# Patient Record
Sex: Female | Born: 1975 | Race: Black or African American | Hispanic: No | Marital: Married | Smoking: Never smoker
Health system: Southern US, Community
[De-identification: ages and names within clinical notes are randomized; demographics above are authoritative.]

## PROBLEM LIST (undated history)

## (undated) DIAGNOSIS — E669 Obesity, unspecified: Secondary | ICD-10-CM

## (undated) DIAGNOSIS — I10 Essential (primary) hypertension: Secondary | ICD-10-CM

## (undated) HISTORY — PX: TONSILLECTOMY: SUR1361

## (undated) HISTORY — PX: TYMPANOSTOMY TUBE PLACEMENT: SHX32

## (undated) HISTORY — DX: Obesity, unspecified: E66.9

---

## 2000-12-26 ENCOUNTER — Other Ambulatory Visit: Admission: RE | Admit: 2000-12-26 | Discharge: 2000-12-26 | Payer: Self-pay | Admitting: Family Medicine

## 2004-10-17 ENCOUNTER — Ambulatory Visit: Payer: Self-pay | Admitting: Family Medicine

## 2005-03-01 ENCOUNTER — Ambulatory Visit: Payer: Self-pay | Admitting: Family Medicine

## 2005-03-28 ENCOUNTER — Ambulatory Visit: Payer: Self-pay | Admitting: Family Medicine

## 2005-06-21 ENCOUNTER — Ambulatory Visit: Payer: Self-pay | Admitting: Family Medicine

## 2005-10-02 ENCOUNTER — Ambulatory Visit: Payer: Self-pay | Admitting: Family Medicine

## 2006-05-27 ENCOUNTER — Ambulatory Visit: Payer: Self-pay | Admitting: Family Medicine

## 2006-06-03 ENCOUNTER — Encounter: Payer: Self-pay | Admitting: Family Medicine

## 2006-06-03 LAB — CONVERTED CEMR LAB
Basophils Absolute: 0.1 10*3/uL (ref 0.0–0.1)
CO2: 26 meq/L (ref 19–32)
Calcium: 9.4 mg/dL (ref 8.4–10.5)
Chloride: 103 meq/L (ref 96–112)
Eosinophils Relative: 2 % (ref 0–5)
Glucose, Bld: 91 mg/dL (ref 70–99)
HCT: 39.2 % (ref 36.0–46.0)
HDL: 44 mg/dL (ref >39)
Hemoglobin: 12.8 g/dL (ref 12.0–15.0)
LDL Cholesterol: 115 mg/dL — ABNORMAL HIGH (ref 0–99)
Lymphocytes Relative: 55 % — ABNORMAL HIGH (ref 12–46)
Lymphs Abs: 2.4 10*3/uL (ref 0.7–3.3)
Monocytes Absolute: 0.5 10*3/uL (ref 0.2–0.7)
Neutro Abs: 1.4 10*3/uL — ABNORMAL LOW (ref 1.7–7.7)
Platelets: 295 10*3/uL (ref 150–400)
Sodium: 141 meq/L (ref 135–145)
Total CHOL/HDL Ratio: 4
WBC: 4.4 10*3/uL (ref 4.0–10.5)

## 2006-06-19 ENCOUNTER — Encounter: Payer: Self-pay | Admitting: Family Medicine

## 2006-06-19 ENCOUNTER — Ambulatory Visit: Payer: Self-pay | Admitting: Family Medicine

## 2006-06-19 ENCOUNTER — Other Ambulatory Visit: Admission: RE | Admit: 2006-06-19 | Discharge: 2006-06-19 | Payer: Self-pay | Admitting: Family Medicine

## 2006-06-21 ENCOUNTER — Encounter: Payer: Self-pay | Admitting: Family Medicine

## 2006-06-21 LAB — CONVERTED CEMR LAB
Chlamydia, DNA Probe: NEGATIVE
GC Probe Amp, Genital: NEGATIVE
Gardnerella vaginalis: NEGATIVE

## 2006-07-30 ENCOUNTER — Ambulatory Visit: Payer: Self-pay | Admitting: Family Medicine

## 2006-10-15 ENCOUNTER — Ambulatory Visit: Payer: Self-pay | Admitting: Orthopedic Surgery

## 2007-02-09 ENCOUNTER — Ambulatory Visit: Payer: Self-pay | Admitting: Family Medicine

## 2007-06-23 ENCOUNTER — Encounter: Payer: Self-pay | Admitting: Family Medicine

## 2007-06-23 ENCOUNTER — Ambulatory Visit: Payer: Self-pay | Admitting: Family Medicine

## 2007-06-23 ENCOUNTER — Other Ambulatory Visit: Admission: RE | Admit: 2007-06-23 | Discharge: 2007-06-23 | Payer: Self-pay | Admitting: Family Medicine

## 2007-06-23 LAB — CONVERTED CEMR LAB: Pap Smear: NORMAL

## 2007-06-24 ENCOUNTER — Encounter: Payer: Self-pay | Admitting: Family Medicine

## 2007-06-24 LAB — CONVERTED CEMR LAB
Basophils Absolute: 0 10*3/uL (ref 0.0–0.1)
Basophils Relative: 1 % (ref 0–1)
CO2: 26 meq/L (ref 19–32)
Calcium: 9.2 mg/dL (ref 8.4–10.5)
Cholesterol: 183 mg/dL (ref 0–200)
Creatinine, Ser: 0.78 mg/dL (ref 0.40–1.20)
Eosinophils Absolute: 0.1 10*3/uL (ref 0.0–0.7)
Eosinophils Relative: 1 % (ref 0–5)
HCT: 38.4 % (ref 36.0–46.0)
HDL: 53 mg/dL (ref >39)
Hemoglobin: 12.2 g/dL (ref 12.0–15.0)
Lymphocytes Relative: 44 % (ref 12–46)
MCHC: 31.8 g/dL (ref 30.0–36.0)
MCV: 81.7 fL (ref 78.0–100.0)
Monocytes Absolute: 0.6 10*3/uL (ref 0.1–1.0)
Platelets: 280 10*3/uL (ref 150–400)
RDW: 14.7 % (ref 11.5–15.5)
Sodium: 140 meq/L (ref 135–145)
Total CHOL/HDL Ratio: 3.5

## 2007-11-27 ENCOUNTER — Ambulatory Visit: Payer: Self-pay | Admitting: Family Medicine

## 2007-11-27 ENCOUNTER — Encounter: Payer: Self-pay | Admitting: Family Medicine

## 2007-11-27 DIAGNOSIS — E669 Obesity, unspecified: Secondary | ICD-10-CM | POA: Insufficient documentation

## 2007-11-27 LAB — CONVERTED CEMR LAB
Blood in Urine, dipstick: NEGATIVE
Ketones, urine, test strip: NEGATIVE
Nitrite: NEGATIVE
Protein, U semiquant: NEGATIVE
Specific Gravity, Urine: 1.02
pH: 7

## 2008-04-27 ENCOUNTER — Telehealth: Payer: Self-pay | Admitting: Family Medicine

## 2008-06-30 ENCOUNTER — Ambulatory Visit: Payer: Self-pay | Admitting: Family Medicine

## 2008-06-30 LAB — CONVERTED CEMR LAB
Bilirubin Urine: NEGATIVE
Specific Gravity, Urine: >=1.03
Urobilinogen, UA: 0.2

## 2008-07-02 ENCOUNTER — Encounter: Payer: Self-pay | Admitting: Family Medicine

## 2008-07-20 ENCOUNTER — Encounter: Payer: Self-pay | Admitting: Family Medicine

## 2008-07-20 LAB — CONVERTED CEMR LAB
CO2: 26 meq/L (ref 19–32)
Chloride: 106 meq/L (ref 96–112)
HDL: 57 mg/dL (ref >39)
Hemoglobin: 12 g/dL (ref 12.0–15.0)
LDL Cholesterol: 110 mg/dL — ABNORMAL HIGH (ref 0–99)
Lymphs Abs: 1.5 10*3/uL (ref 0.7–4.0)
Monocytes Relative: 12 % (ref 3–12)
Neutro Abs: 1.7 10*3/uL (ref 1.7–7.7)
Neutrophils Relative %: 45 % (ref 43–77)
Potassium: 5 meq/L (ref 3.5–5.3)
RBC: 4.67 M/uL (ref 3.87–5.11)
Sodium: 143 meq/L (ref 135–145)
TSH: 1.291 microintl units/mL (ref 0.350–4.500)
Total CHOL/HDL Ratio: 3.2
VLDL: 14 mg/dL (ref 0–40)
WBC: 3.8 10*3/uL — ABNORMAL LOW (ref 4.0–10.5)

## 2008-10-13 ENCOUNTER — Ambulatory Visit: Payer: Self-pay | Admitting: Family Medicine

## 2008-10-13 ENCOUNTER — Other Ambulatory Visit: Admission: RE | Admit: 2008-10-13 | Discharge: 2008-10-13 | Payer: Self-pay | Admitting: Family Medicine

## 2008-10-13 ENCOUNTER — Encounter: Payer: Self-pay | Admitting: Family Medicine

## 2008-10-14 DIAGNOSIS — R229 Localized swelling, mass and lump, unspecified: Secondary | ICD-10-CM | POA: Insufficient documentation

## 2008-10-14 DIAGNOSIS — N771 Vaginitis, vulvitis and vulvovaginitis in diseases classified elsewhere: Secondary | ICD-10-CM | POA: Insufficient documentation

## 2008-10-15 ENCOUNTER — Encounter: Payer: Self-pay | Admitting: Family Medicine

## 2008-10-15 LAB — CONVERTED CEMR LAB: Trichomonal Vaginitis: NEGATIVE

## 2008-10-17 ENCOUNTER — Encounter: Payer: Self-pay | Admitting: Family Medicine

## 2009-02-27 ENCOUNTER — Ambulatory Visit: Payer: Self-pay | Admitting: Family Medicine

## 2009-02-27 ENCOUNTER — Telehealth: Payer: Self-pay | Admitting: Family Medicine

## 2009-02-27 DIAGNOSIS — H6120 Impacted cerumen, unspecified ear: Secondary | ICD-10-CM | POA: Insufficient documentation

## 2009-02-27 DIAGNOSIS — D239 Other benign neoplasm of skin, unspecified: Secondary | ICD-10-CM | POA: Insufficient documentation

## 2009-03-03 ENCOUNTER — Encounter: Payer: Self-pay | Admitting: Family Medicine

## 2009-03-10 ENCOUNTER — Encounter: Payer: Self-pay | Admitting: Family Medicine

## 2009-06-13 ENCOUNTER — Ambulatory Visit: Payer: Self-pay | Admitting: Family Medicine

## 2009-06-13 DIAGNOSIS — R509 Fever, unspecified: Secondary | ICD-10-CM | POA: Insufficient documentation

## 2009-06-13 DIAGNOSIS — J209 Acute bronchitis, unspecified: Secondary | ICD-10-CM | POA: Insufficient documentation

## 2009-09-08 ENCOUNTER — Ambulatory Visit: Payer: Self-pay | Admitting: Family Medicine

## 2009-09-08 DIAGNOSIS — H1030 Unspecified acute conjunctivitis, unspecified eye: Secondary | ICD-10-CM | POA: Insufficient documentation

## 2009-11-09 ENCOUNTER — Ambulatory Visit: Payer: Self-pay | Admitting: Family Medicine

## 2009-11-09 ENCOUNTER — Other Ambulatory Visit: Admission: RE | Admit: 2009-11-09 | Discharge: 2009-11-09 | Payer: Self-pay | Admitting: Family Medicine

## 2009-11-09 DIAGNOSIS — B079 Viral wart, unspecified: Secondary | ICD-10-CM | POA: Insufficient documentation

## 2009-12-14 ENCOUNTER — Ambulatory Visit: Payer: Self-pay | Admitting: Family Medicine

## 2009-12-17 DIAGNOSIS — L989 Disorder of the skin and subcutaneous tissue, unspecified: Secondary | ICD-10-CM | POA: Insufficient documentation

## 2009-12-18 LAB — CONVERTED CEMR LAB
BUN: 12 mg/dL (ref 6–23)
Chloride: 102 meq/L (ref 96–112)
Eosinophils Absolute: 0.1 10*3/uL (ref 0.0–0.7)
Glucose, Bld: 88 mg/dL (ref 70–99)
Hgb A1c MFr Bld: 5.7 % — ABNORMAL HIGH (ref <5.7)
LDL Cholesterol: 136 mg/dL — ABNORMAL HIGH (ref 0–99)
Lymphs Abs: 1.6 10*3/uL (ref 0.7–4.0)
MCV: 83.5 fL (ref 78.0–100.0)
Monocytes Relative: 12 % (ref 3–12)
Neutro Abs: 1.6 10*3/uL — ABNORMAL LOW (ref 1.7–7.7)
Neutrophils Relative %: 44 % (ref 43–77)
Potassium: 4.4 meq/L (ref 3.5–5.3)
RBC: 4.8 M/uL (ref 3.87–5.11)
VLDL: 14 mg/dL (ref 0–40)
WBC: 3.7 10*3/uL — ABNORMAL LOW (ref 4.0–10.5)

## 2010-01-08 ENCOUNTER — Telehealth: Payer: Self-pay | Admitting: Family Medicine

## 2010-03-08 ENCOUNTER — Telehealth: Payer: Self-pay | Admitting: Family Medicine

## 2010-03-09 ENCOUNTER — Ambulatory Visit: Payer: Self-pay | Admitting: Family Medicine

## 2010-03-09 LAB — CONVERTED CEMR LAB
Bilirubin Urine: NEGATIVE
Glucose, Urine, Semiquant: NEGATIVE
pH: 7

## 2010-03-29 ENCOUNTER — Telehealth (INDEPENDENT_AMBULATORY_CARE_PROVIDER_SITE_OTHER): Payer: Self-pay | Admitting: *Deleted

## 2010-04-02 ENCOUNTER — Encounter: Payer: Self-pay | Admitting: Family Medicine

## 2010-04-16 ENCOUNTER — Ambulatory Visit: Payer: Self-pay | Admitting: Family Medicine

## 2010-04-16 DIAGNOSIS — N644 Mastodynia: Secondary | ICD-10-CM | POA: Insufficient documentation

## 2010-04-16 DIAGNOSIS — E785 Hyperlipidemia, unspecified: Secondary | ICD-10-CM | POA: Insufficient documentation

## 2010-04-18 ENCOUNTER — Ambulatory Visit (HOSPITAL_COMMUNITY)
Admission: RE | Admit: 2010-04-18 | Discharge: 2010-04-18 | Payer: Self-pay | Source: Home / Self Care | Attending: Family Medicine | Admitting: Family Medicine

## 2010-05-27 LAB — CONVERTED CEMR LAB
Bilirubin Urine: NEGATIVE
Blood in Urine, dipstick: NEGATIVE
Ketones, urine, test strip: NEGATIVE
Urobilinogen, UA: 0.2
pH: 7.5

## 2010-05-29 NOTE — Progress Notes (Signed)
Summary: breast problem  Phone Note Call from Patient   Summary of Call: Patient states that her right breast and nipple are sore and up under her arm is sore, and found a small lump the size of a pea.. please called back @ [phone] cell or [phone] hm Initial call taken by: Eugenio Hoes,  March 29, 2010 8:22 AM  Follow-up for Phone Call        please have patient schedule appt for next week, first available appt Follow-up by: Adella Hare LPN,  March 29, 2010 4:25 PM  Additional Follow-up for Phone Call Additional follow up Details #1::        appt. 12.5.11  @ 8:30 Additional Follow-up by: Lind Guest,  March 30, 2010 9:46 AM

## 2010-05-29 NOTE — Progress Notes (Signed)
Summary: having to go to the bathroom at night x 2 times at least  Phone Note Call from Patient   Summary of Call: having to go to bathroom at night at least 2 x a night and her back is hurting also wants a appoinment none ava.  call back at [phone] Initial call taken by: Lind Guest,  March 08, 2010 8:16 AM  Follow-up for Phone Call        bring her in this pm nurse to check CCUA, nurse visit only asap pls, explain to her she can be checked for UTI today,seeing nurse only, if she feels she needs more then urgent care, needs to come by 2:30 latest, pls let nurse know if she is coming  Follow-up by: Syliva Overman MD,  March 08, 2010 1:11 PM  Additional Follow-up for Phone Call Additional follow up Details #1::        coming in the morning at 9:00 Additional Follow-up by: Lind Guest,  March 08, 2010 5:22 PM

## 2010-05-29 NOTE — Progress Notes (Signed)
Summary: referral for warts  Phone Note Call from Patient   Summary of Call: would like to get another referral to dermotologist. not hall is this okay for warts. [phone] Initial call taken by: Rudene Anda,  January 08, 2010 10:24 AM  Follow-up for Phone Call        pls refer to dermatol;ogist in [place] to eval warts, ensure Jonita Albee is ojk with the pt Follow-up by: Syliva Overman MD,  January 08, 2010 10:58 AM  Additional Follow-up for Phone Call Additional follow up Details #1::        pt has appt with dr. Joseph Art for 01/15/2010 8:30. pt aware Additional Follow-up by: Rudene Anda,  January 10, 2010 2:38 PM

## 2010-05-29 NOTE — Assessment & Plan Note (Signed)
Summary: uti  Nurse Visit  Comments patient came in today for CCUA, flank pain   Allergies: No Known Drug Allergies Laboratory Results   Urine Tests  Date/Time Received: March 09, 2010 10:19 AM  Date/Time Reported: March 09, 2010 10:19 AM   Routine Urinalysis   Color: yellow Appearance: Clear Glucose: negative   (Normal Range: Negative) Bilirubin: negative   (Normal Range: Negative) Ketone: negative   (Normal Range: Negative) Spec. Gravity: 1.025   (Normal Range: 1.003-1.035) Blood: negative   (Normal Range: Negative) pH: 7.0   (Normal Range: 5.0-8.0) Protein: negative   (Normal Range: Negative) Urobilinogen: 0.2   (Normal Range: 0-1) Nitrite: negative   (Normal Range: Negative) Leukocyte Esterace: negative   (Normal Range: Negative)       Orders Added: 1)  Urinalysis [81003-65000]  Laboratory Results   Urine Tests    Routine Urinalysis   Color: yellow Appearance: Clear Glucose: negative   (Normal Range: Negative) Bilirubin: negative   (Normal Range: Negative) Ketone: negative   (Normal Range: Negative) Spec. Gravity: 1.025   (Normal Range: 1.003-1.035) Blood: negative   (Normal Range: Negative) pH: 7.0   (Normal Range: 5.0-8.0) Protein: negative   (Normal Range: Negative) Urobilinogen: 0.2   (Normal Range: 0-1) Nitrite: negative   (Normal Range: Negative) Leukocyte Esterace: negative   (Normal Range: Negative)       Appended Document: uti pt had symptoms of uTI with neg CCUA, she is to drink at least 48 ounces water daily, void often and call back if symptoms worsen for an appt

## 2010-05-29 NOTE — Assessment & Plan Note (Signed)
Summary: ov   Vital Signs:  Patient profile:   35 year old female Menstrual status:  regular Height:      66.5 inches Weight:      208.25 pounds BMI:     33.23 O2 Sat:      95 % Temp:     102.7 degrees F oral Pulse rate:   108 / minute Pulse rhythm:   regular Resp:     16 per minute  Vitals Entered By: Everitt Amber LPN (June 13, 2009 3:47 PM)  Nutrition Counseling: Patient's BMI is greater than 25 and therefore counseled on weight management options. CC: started sunday, coughing really bad, her chest hurts, head aching, eyes aching, high fever, feels pressure in her chest when she lays down, very sore on right side of chest and back, also the wind takes her breath away, she has to sit down.   CC:  started sunday, coughing really bad, her chest hurts, head aching, eyes aching, high fever, feels pressure in her chest when she lays down, very sore on right side of chest and back, also the wind takes her breath away, and she has to sit down.Marland Kitchen  History of Present Illness: Scratchy throat  5 days ago which has progressed to shest congestion, a wet cough whuich is non productive , but causes chest pain and shortness of breath. Couugh is wprse at night and disturbs her sleep. Fever , chils, chest pin but no body sches  Current Medications (verified): 1)  None  Allergies (verified): No Known Drug Allergies  Review of Systems      See HPI General:  Complains of chills, fatigue, fever, loss of appetite, malaise, and sleep disorder. Eyes:  Denies blurring and discharge. ENT:  Denies hoarseness, nasal congestion, and postnasal drainage. CV:  Denies chest pain or discomfort, difficulty breathing while lying down, palpitations, and swelling of feet. GI:  Denies abdominal pain, constipation, diarrhea, nausea, and vomiting. GU:  Denies dysuria and urinary frequency. MS:  Complains of muscle aches; denies joint pain. Neuro:  Complains of headaches; denies seizures and sensation of room  spinning. Psych:  Denies anxiety and easily angered. Endo:  Denies cold intolerance, excessive hunger, excessive thirst, excessive urination, heat intolerance, polyuria, and weight change. Heme:  Denies abnormal bruising and bleeding. Allergy:  Complains of seasonal allergies.  Physical Exam  General:  Well-developed,well-nourished,in no acute distress; alert,appropriate and cooperative throughout examinationill appearing. HEENT: No facial asymmetry,  EOMI, No sinus tenderness, TM's Clear, oropharynx  pink and moist.   Chest:decreased air entry, with bilateral crackles and wheezes.  CVS: S1, S2, No murmurs, No S3.   Abd: Soft, Nontender.  MS: Adequate ROM spine, hips, shoulders and knees.  Ext: No edema.   CNS: CN 2-12 intact, power tone and sensation normal throughout.   Skin: Intact, no visible lesions or rashes.  Psych: Good eye contact, normal affect.  Memory intact, not anxious or depressed appearing.    Impression & Recommendations:  Problem # 1:  ACUTE BRONCHITIS (ICD-466.0) Assessment Comment Only  Her updated medication list for this problem includes:    Tessalon Perles 100 Mg Caps (Benzonatate) .Marland Kitchen... Take 1 capsule by mouth three times a day    Penicillin V Potassium 500 Mg Tabs (Penicillin v potassium) .Marland Kitchen... Take 1 tablet by mouth three times a day  Orders: CXR- 2view (CXR) Albuterol Sulfate Sol 1mg  unit dose (U9811) Atrovent 1mg  (Neb) (B1478) Nebulizer Tx (29562)  Problem # 2:  OBESITY (ICD-278.00) Assessment: Unchanged  Ht:  66.5 (06/13/2009)   Wt: 208.25 (06/13/2009)   BMI: 33.23 (06/13/2009)  Complete Medication List: 1)  Tessalon Perles 100 Mg Caps (Benzonatate) .... Take 1 capsule by mouth three times a day 2)  Penicillin V Potassium 500 Mg Tabs (Penicillin v potassium) .... Take 1 tablet by mouth three times a day  Other Orders: Tylenol 325 mg tab [facility]  Patient Instructions: 1)  CPE end June. 2)  You are being treated for acute bronchitis. 3)   You will get injections in the office and meds are also sent to your pharmacy . Pls complete the entire course of meds. 4)  Get plenty of rest, drink lots of clear liquids, and use Tylenol or Ibuprofen for fever and comfort. Call in 3 to 5 days if you're not better:sooner if you're feeling worse. 5)  CXR today Prescriptions: PENICILLIN V POTASSIUM 500 MG TABS (PENICILLIN V POTASSIUM) Take 1 tablet by mouth three times a day  #30 x 0   Entered and Authorized by:   Syliva Overman MD   Signed by:   Syliva Overman MD on 06/13/2009   Method used:   Electronically to        [facility] (retail)       [address]       [place], [place]  [address]       Ph: [phone]       Fax: [phone]   RxID:   [id number] TESSALON PERLES 100 MG CAPS (BENZONATATE) Take 1 capsule by mouth three times a day  #30 x 0   Entered and Authorized by:   Syliva Overman MD   Signed by:   Syliva Overman MD on 06/13/2009   Method used:   Electronically to        [facility] (retail)       [address]       [place], [place]  [address]       Ph: [phone]       Fax: [phone]   RxID:   [id number]    Medication Administration  Medication # 1:    Medication: Albuterol Sulfate Sol 1mg  unit dose    Diagnosis: ACUTE BRONCHITIS (ICD-466.0)    Dose: 2.5/71ml    Route: inhaled    Exp Date: 12/11    Lot #: [id number]    Mfr: nephron    Patient tolerated medication without complications    Given by: Everitt Amber LPN (June 13, 2009 3:57 PM)  Medication # 2:    Medication: Atrovent 1mg  (Neb)    Diagnosis: ACUTE BRONCHITIS (ICD-466.0)    Dose: 0.5/2.41ml    Route: inhaled    Exp Date: 10/12    Lot #: [id number]    Mfr: nephron    Patient tolerated medication without complications    Given by: Everitt Amber LPN (June 13, 2009 3:58 PM)  Medication # 3:    Medication: Tylenol 325 mg tab    Diagnosis: FEVER UNSPECIFIED  (ICD-780.60)    Dose: 2 tablets    Route: po    Exp Date: 06/2009    Lot #: [id number]    Mfr: goldline    Comments: 650mg  given     Patient tolerated medication without complications    Given by: Everitt Amber LPN (June 13, 2009 4:28 PM)  Orders Added: 1)  Est. Patient Level IV [35573] 2)  CXR- 2view [  CXR] 3)  Albuterol Sulfate Sol 1mg  unit dose [J7613] 4)  Atrovent 1mg  (Neb) [B2841] 5)  Nebulizer Tx [94640] 6)  Tylenol 325 mg tab [EMRORAL] Patient refused Rocephin injection

## 2010-05-29 NOTE — Assessment & Plan Note (Signed)
Summary: Pink eye Room 2    Vital Signs:  Patient profile:   35 year old female Menstrual status:  regular Height:      66.5 inches Weight:      205.75 pounds BMI:     32.83 O2 Sat:      98 % Pulse rate:   62 / minute Resp:     16 per minute BP sitting:   112 / 82  (left arm) Cuff size:   large  Vitals Entered By: Everitt Amber LPN (Sep 08, 2009 10:20 AM) CC: wants to make sure she doesn't have pink eye. Left eye has been swollen and felt like sand was in there. Had yellowish drainage and when she woke up it was closed and dried yellowish crust   CC:  wants to make sure she doesn't have pink eye. Left eye has been swollen and felt like sand was in there. Had yellowish drainage and when she woke up it was closed and dried yellowish crust.  History of Present Illness: Pt noticed yesterday that her Lt eye felt like sand in it, and was itchy/irritated.  Had some yellow mucus drainage too.  Awoke this am & upper lid is swollen and was crusted shut.  She has had a little nasal congestion.  No sinus pressure, post nasal drainage, or cough.    Current Medications (verified): 1)  None  Allergies (verified): No Known Drug Allergies  Past History:  Past medical history reviewed for relevance to current acute and chronic problems.  Past Medical History: Reviewed history from 11/27/2007 and no changes required. Current Problems:  OBESITY (ICD-278.00)  Review of Systems General:  Denies chills and fever. Eyes:  Complains of discharge, itching, and red eye; denies eye pain. ENT:  Complains of nasal congestion; denies earache, postnasal drainage, sinus pressure, and sore throat. Resp:  Denies cough and shortness of breath.  Physical Exam  General:  Well-developed,well-nourished,in no acute distress; alert,appropriate and cooperative throughout examination Head:  Normocephalic and atraumatic without obvious abnormalities. No apparent alopecia or balding. Eyes:  pupils equal, pupils  round, and pupils reactive to light.  Lt upper lid mildly swollen & erythematous.  No nodule or hordelum noted.  Sclera is not injected.  Conjunctiva on Lt is mildy erythematous. Ears:  External ear exam shows no significant lesions or deformities.  Otoscopic examination reveals clear canals, tympanic membranes are intact bilaterally without bulging, retraction, inflammation or discharge. Hearing is grossly normal bilaterally. Nose:  External nasal examination shows no deformity or inflammation. Nasal mucosa are pink and moist without lesions or exudates. Mouth:  Oral mucosa and oropharynx without lesions or exudates.  Teeth in good repair. Neck:  No deformities, masses, or tenderness noted. Lungs:  Normal respiratory effort, chest expands symmetrically. Lungs are clear to auscultation, no crackles or wheezes. Heart:  Normal rate and regular rhythm. S1 and S2 normal without gallop, murmur, click, rub or other extra sounds. Cervical Nodes:  No lymphadenopathy noted Psych:  Cognition and judgment appear intact. Alert and cooperative with normal attention span and concentration. No apparent delusions, illusions, hallucinations   Impression & Recommendations:  Problem # 1:  CONJUNCTIVITIS, ACUTE (ICD-372.00) Assessment New  Her updated medication list for this problem includes:    Ciprofloxacin Hcl 0.3 % Soln (Ciprofloxacin hcl) .Marland Kitchen... Put 2 drops in lt eye three times a day for 7 days  Complete Medication List: 1)  Ciprofloxacin Hcl 0.3 % Soln (Ciprofloxacin hcl) .... Put 2 drops in lt eye three times  a day for 7 days  Patient Instructions: 1)  Please schedule a follow-up appointment as needed. 2)  You may use cool compressses to help with swelling. 3)  I have prescribed an antibiotic eye drop for your eye infection. 4)  Wash your hands frequently, etc as discussed to reduce the risk of spreading. Prescriptions: CIPROFLOXACIN HCL 0.3 % SOLN (CIPROFLOXACIN HCL) put 2 drops in Lt eye three  times a day for 7 days  #1 bottle x 0   Entered and Authorized by:   Esperanza Sheets PA   Signed by:   Esperanza Sheets PA on 09/08/2009   Method used:   Electronically to        [facility] (retail)       [address]       [place], [place]  [address]       Ph: [phone]       Fax: [phone]   RxID:   [id number]

## 2010-05-29 NOTE — Assessment & Plan Note (Signed)
Summary: office visit   Vital Signs:  Patient profile:   35 year old female Menstrual status:  regular Height:      66.5 inches Weight:      204.50 pounds BMI:     32.63 O2 Sat:      98 % on Room air Pulse rate:   66 / minute Pulse rhythm:   regular Resp:     16 per minute BP sitting:   112 / 82  (left arm)  Vitals Entered By: Adella Hare LPN (December 14, 2009 8:33 AM)  Nutrition Counseling: Patient's BMI is greater than 25 and therefore counseled on weight management options.  O2 Flow:  Room air CC: concerned that she has been exposed to MRSA Is Patient Diabetic? No Pain Assessment Patient in pain? no        CC:  concerned that she has been exposed to MRSA.  History of Present Illness: Reports  that she has been doing fairly well. She has concerns about exposure to MRSA, she has 2 lesions on her skin, has questions about the illness, and wonders if she needs to be treated. She is concerned that diabetes is i her family, she has tried dietary modificATION FOR WEIGHT LOSS WITH LITTLE SUCCESS, PORTION CONTROL IS HER BIG PROBLEM. Denies recent fever or chills. Denies sinus pressure, nasal congestion , ear pain or sore throat. Denies chest congestion, or cough productive of sputum. Denies chest pain, palpitations, PND, orthopnea or leg swelling. Denies abdominal pain, nausea, vomitting, diarrhea or constipation. Yetta Barre change in bowel movements or bloody stool. Denies dysuria , frequency, incontinence or hesitancy. Denies  joint pain, swelling, or reduced mobility. Denies headaches, vertigo, seizures. Denies depression, anxiety or insomnia.      Current Medications (verified): 1)  None  Allergies (verified): No Known Drug Allergies  Review of Systems      See HPI Eyes:  Denies blurring and discharge. Derm:  Complains of lesion(s). Endo:  Denies cold intolerance, excessive thirst, and excessive urination. Heme:  Denies abnormal bruising and bleeding. Allergy:   Denies hives or rash and itching eyes.  Physical Exam  General:  Well-developed,well-nourished,in no acute distress; alert,appropriate and cooperative throughout examination HEENT: No facial asymmetry,  EOMI, No sinus tenderness, TM's Clear, oropharynx  pink and moist.   Chest: Clear to auscultation bilaterally.  CVS: S1, S2, No murmurs, No S3.   Abd: Soft, Nontender.  MS: Adequate ROM spine, hips, shoulders and knees.  Ext: No edema.   CNS: CN 2-12 intact, power tone and sensation normal throughout.   Skin: Intact, ERYTHEMATOUS LESION , NON TENDER ON LEFT FOREARM, ALSO HYPERPIGMENTEDLESION ON RIGHT ANTERIOR CHEST Psych: Good eye contact, normal affect.  Memory intact, not anxious or depressed appearing.    Impression & Recommendations:  Problem # 1:  OBESITY (ICD-278.00) Assessment Improved  Orders: T-Lipid Profile ([id number])  Ht: 66.5 (12/14/2009)   Wt: 204.50 (12/14/2009)   BMI: 32.63 (12/14/2009)  Problem # 2:  UNSPECIFIED DISORDER OF SKIN&SUBCUTANEOUS TISSUE (ICD-709.9) Assessment: Comment Only Pt has healing skin lesion on chest and erythematous lesion on forearm, no sign of infection, she is reassured, and gen education on mRSA provided  Other Orders: T-Basic Metabolic Panel [id number]) T-CBC w/Diff [phone]) T- Hemoglobin A1C ([id number])  Patient Instructions: 1)  Please schedule a follow-up appointment in 4 months. 2)  It is important that you exercise regularly at least 30 minutes76 times a week. If you develop chest pain, have severe difficulty breathing, or feel very tired , stop  exercising immediately and seek medical attention. 3)  You need to lose weight. Consider a lower calorie diet and regular exercise. CONGRATS on 6 pound  weight loss. 4)  BMP prior to visit, ICD-9: 5)  Lipid Panel prior to visit, ICD-9:   fasting asap 6)  CBC w/ Diff prior to visit, ICD-9: 7)  HbgA1C prior to visit, ICD-9: 8)  BACTROBAN  ointment is the most effective  antimicrobial ointment to use for skin infection

## 2010-05-29 NOTE — Letter (Signed)
Summary: 1ST MISSED LETTER  1ST MISSED LETTER   Imported By: Lind Guest 04/02/2010 15:09:10  _____________________________________________________________________  External Attachment:    Type:   Image     Comment:   External Document

## 2010-05-29 NOTE — Assessment & Plan Note (Signed)
Summary: physical ROOM-3   Vital Signs:  Patient profile:   35 year old female Menstrual status:  regular Height:      66.5 inches Weight:      206.50 pounds BMI:     32.95 O2 Sat:      97 % Pulse rate:   71 / minute Resp:     15 per minute BP sitting:   102 / 74  (left arm) Cuff size:   large  Vitals Entered By: Everitt Amber LPN (November 09, 2009 10:29 AM)  Nutrition Counseling: Patient's BMI is greater than 25 and therefore counseled on weight management options. CC: CPE Comments Patient received a right ear flushing. Patient tolerated procedure well and ear successfully irrigated with no complications.   CC:  CPE.  History of Present Illness: Pt presents today for a physical. Last pap/physical June 2010.  No hx of abn paps.  Husb had vas. No SBE.  No prev mamm. Td UTD. No prev eye exam.  Pt c/o warts Rt hand.  She has had these x couple yrs.  Started with one and now has 3. She has tried over the counter cryo.  And also went to derm about 1 yr ago and had cryo done.  She did not return to derm because of the expense with her deductable.  Pt states she was having Lt ear pain about 1 week ago.  She was able to pull something out of her ear & it has felt better since.  Pt brought this with her today & it is a lg clump of dk cerumen.  Current Medications (verified): 1)  None  Allergies (verified): No Known Drug Allergies  Past History:  Past medical, surgical, family and social histories (including risk factors) reviewed for relevance to current acute and chronic problems.  Past Medical History: Reviewed history from 11/27/2007 and no changes required. Current Problems:  OBESITY (ICD-278.00)  Past Surgical History: Reviewed history from 11/27/2007 and no changes required. none  Family History: Reviewed history from 11/27/2007 and no changes required. Mother deceased 59- MVA Father living - DM Two brothers - presumed healthy  Social History: Reviewed history  from 11/27/2007 and no changes required. Unemployed Married 2 children Never Smoked Alcohol use-no Drug use-no  Review of Systems General:  Complains of fatigue; denies chills and fever. Eyes:  Denies blurring and double vision. ENT:  Complains of decreased hearing and earache; denies ear discharge, nasal congestion, ringing in ears, and sore throat; EAR DISCOMFORT AND DECREASED HEARING WITH WAX BUILDUP.Marland Kitchen CV:  Denies chest pain or discomfort and palpitations. Resp:  Denies cough and shortness of breath. GI:  Denies abdominal pain, bloody stools, change in bowel habits, constipation, dark tarry stools, diarrhea, indigestion, nausea, and vomiting. GU:  Complains of nocturia; denies abnormal vaginal bleeding, discharge, dysuria, and urinary frequency. Derm:  Complains of lesion(s); WARTS RT HAND. Psych:  Denies anxiety and depression. Allergy:  Complains of itching eyes, seasonal allergies, and sneezing.  Physical Exam  General:  Well-developed,well-nourished,in no acute distress; alert,appropriate and cooperative throughout examination Head:  Normocephalic and atraumatic without obvious abnormalities. No apparent alopecia or balding. Eyes:  pupils equal, pupils round, pupils reactive to light, and no injection.   Ears:  External ear exam shows no significant lesions or deformities.  Otoscopic examination reveals clear canal Lt and cerumen Rt, tympanic membranes are intact bilaterally without bulging, retraction, inflammation or discharge. Hearing is grossly normal bilaterally. Nose:  External nasal examination shows no deformity or inflammation. Nasal  mucosa are pink and moist without lesions or exudates. Mouth:  Oral mucosa and oropharynx without lesions or exudates.  Teeth in good repair. Neck:  No deformities, masses, or tenderness noted. Chest Wall:  no deformities and no mass.   Breasts:  No mass, nodules, thickening, tenderness, bulging, retraction, inflamation, nipple discharge or  skin changes noted.   Lungs:  Normal respiratory effort, chest expands symmetrically. Lungs are clear to auscultation, no crackles or wheezes. Heart:  Normal rate and regular rhythm. S1 and S2 normal without gallop, murmur, click, rub or other extra sounds. Abdomen:  Bowel sounds positive,abdomen soft and non-tender without masses, organomegaly or hernias noted. Genitalia:  Normal introitus for age, no external lesions, no vaginal discharge, mucosa pink and moist, no vaginal or cervical lesions, no vaginal atrophy, no friaility or hemorrhage, normal uterus size and position, no adnexal masses or tenderness Pulses:  R posterior tibial normal, R dorsalis pedis normal, L posterior tibial normal, and L dorsalis pedis normal.   Extremities:  No clubbing, cyanosis, edema, or deformity noted with normal full range of motion of all joints.   Neurologic:  alert & oriented X3, sensation intact to light touch, gait normal, and DTRs symmetrical and normal.   Skin:  3 verucca ventral aspect of digits Rt hand. Cervical Nodes:  No lymphadenopathy noted Axillary Nodes:  No palpable lymphadenopathy Psych:  Cognition and judgment appear intact. Alert and cooperative with normal attention span and concentration. No apparent delusions, illusions, hallucinations   Impression & Recommendations:  Problem # 1:  Preventive Health Care (ICD-V70.0) Encouraged Ca+ and Vit D suppl.  Problem # 2:  OBESITY (ICD-278.00) Assessment: Unchanged Encouraged healthy diet, exercise and wt loss.  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  Pt does not want to return to derm at this time due to expense.  She is going to try an over the counter topical  product.  Other Orders: Pap Smear (40981) Urinalysis ([id number])  Patient Instructions: 1)  Please schedule a follow-up appointment in 6 months.  2)  You should have a physical again in 1 year. 3)  I recommend you try over the counter treatment for your warts as discussed.  If they do not resolve you will need to return to the dermatologist. 4)  I recommend over the counter calcium with Vit D as discussed.  You should get 1000-1200 mg of calcium a day.   5)  It is important that you exercise regularly at least 20 minutes 5 times a week. If you develop chest pain, have severe difficulty breathing, or feel very tired , stop exercising immediately and seek medical attention. 6)  You need to lose weight. Consider a lower calorie diet and regular exercise.   Laboratory Results   Urine Tests    Routine Urinalysis   Color: yellow Appearance: Clear Glucose: negative   (Normal Range: Negative) Bilirubin: negative   (Normal Range: Negative) Ketone: negative   (Normal Range: Negative) Spec. Gravity: 1.020   (Normal Range: 1.003-1.035) Blood: negative   (Normal Range: Negative) pH: 7.5   (Normal Range: 5.0-8.0) Protein: negative   (Normal Range: Negative) Urobilinogen: 0.2   (Normal Range: 0-1) Nitrite: negative   (Normal Range: Negative) Leukocyte Esterace: negative   (Normal Range: Negative)

## 2010-05-31 NOTE — Assessment & Plan Note (Signed)
Summary: lump under right arm   Vital Signs:  Patient profile:   35 year old female Menstrual status:  regular Height:      66.5 inches Weight:      215 pounds BMI:     34.31 O2 Sat:      98 % on Room air Pulse rate:   71 / minute Pulse rhythm:   regular Resp:     16 per minute BP sitting:   114 / 80  (left arm)  Vitals Entered By: Adella Hare LPN (April 16, 2010 9:38 AM)  Nutrition Counseling: Patient's BMI is greater than 25 and therefore counseled on weight management options.  O2 Flow:  Room air CC: knot under right arm Is Patient Diabetic? No   CC:  knot under right arm.  History of Present Illness: 3 week h/o painful lesion in right axilla, no drainage or fever. Right nipple was sore for approx 10 days, most recentlywas about 1 weeek ago. Reports  that she is otherwise doing well, except for continued weight gain, she makes lifestyle chnges inconsitently. Denies recent fever or chills. Denies sinus pressure, nasal congestion , ear pain or sore throat. Denies chest congestion, or cough productive of sputum. Denies chest pain, palpitations, PND, orthopnea or leg swelling. Denies abdominal pain, nausea, vomitting, diarrhea or constipation. Denies change in bowel movements or bloody stool. Denies dysuria , frequency, incontinence or hesitancy. Denies  joint pain, swelling, or reduced mobility. Denies headaches, vertigo, seizures. Denies depression, anxiety or insomnia. Denies  rash, lesions, or itch.     Current Medications (verified): 1)  None  Allergies (verified): No Known Drug Allergies  Review of Systems      See HPI Eyes:  Denies blurring and discharge. Derm:  Complains of lesion(s). Neuro:  Denies headaches and seizures. Endo:  Denies cold intolerance, excessive hunger, excessive thirst, excessive urination, and heat intolerance. Heme:  Denies abnormal bruising and bleeding. Allergy:  Denies hives or rash.  Physical Exam  General:   Well-developed,obese,in no acute distress; alert,appropriate and cooperative throughout examination HEENT: No facial asymmetry,  EOMI, No sinus tenderness, TM's Clear, oropharynx  pink and moist.  Breast:no mass or tenderness on exam.Pea sized nodule in right axilla, tender to deep palpation. No erythema or warmth of associated skin  Chest: Clear to auscultation bilaterally.  CVS: S1, S2, No murmurs, No S3.   Abd: Soft, Nontender.  MS: Adequate ROM spine, hips, shoulders and knees.  Ext: No edema.   CNS: CN 2-12 intact, power tone and sensation normal throughout.   Skin: Intact, no visible lesions or rashes.  Psych: Good eye contact, normal affect.  Memory intact, not anxious or depressed appearing.    Impression & Recommendations:  Problem # 1:  LOCALIZED SUPERFICIAL SWELLING MASS OR LUMP (ICD-782.2) Assessment Comment Only  Orders: Radiology Referral (Radiology)  Problem # 2:  MASTALGIA (ICD-611.71) Assessment: Comment Only  Orders: Radiology Referral (Radiology)  Problem # 3:  OBESITY (ICD-278.00) Assessment: Deteriorated  Ht: 66.5 (04/16/2010)   Wt: 215 (04/16/2010)   BMI: 34.31 (04/16/2010) therapeutic lifestyle change discussed and encouraged  Problem # 4:  HYPERLIPIDEMIA (ICD-272.4) Assessment: Deteriorated    HDL:53 (12/14/2009), 57 (07/20/2008)  LDL:136 (12/14/2009), 110 (07/20/2008)  Chol:203 (12/14/2009), 181 (07/20/2008)  Trig:68 (12/14/2009), 68 (07/20/2008) Low fat dietdiscussed and encouraged  Other Orders: T-Lipid Profile ([id number]) T- Hemoglobin A1C ([id number])  Patient Instructions: 1)  Please schedule a follow-up appointment in 3 months. 2)  It is important that you exercise regularly at least  30 minutes 5 times a week. If you develop chest pain, have severe difficulty breathing, or feel very tired , stop exercising immediately and seek medical attention. 3)  You need to lose weight. Consider a lower calorie diet and regular exercise.  4)  ls  follow a low fat diet 5)  Fasting lipid and HBA1C in 3 months. 6)  You are being referred for a mamo of right breast   Orders Added: 1)  Est. Patient Level III [81191] 2)  T-Lipid Profile [[id number]] 3)  T- Hemoglobin A1C [[id number]] 4)  Radiology Referral [Radiology]

## 2010-07-04 ENCOUNTER — Encounter: Payer: Self-pay | Admitting: Family Medicine

## 2010-07-16 ENCOUNTER — Encounter: Payer: Self-pay | Admitting: Family Medicine

## 2010-07-17 ENCOUNTER — Other Ambulatory Visit: Payer: Self-pay | Admitting: Family Medicine

## 2010-07-17 LAB — LIPID PANEL: Cholesterol: 188 mg/dL (ref 0–200)

## 2010-07-17 LAB — HEMOGLOBIN A1C: Mean Plasma Glucose: 126 mg/dL — ABNORMAL HIGH (ref <117)

## 2010-07-18 ENCOUNTER — Ambulatory Visit (INDEPENDENT_AMBULATORY_CARE_PROVIDER_SITE_OTHER): Payer: BC Managed Care – PPO | Admitting: Family Medicine

## 2010-07-18 ENCOUNTER — Encounter: Payer: Self-pay | Admitting: Family Medicine

## 2010-07-18 VITALS — BP 120/80 | HR 64 | Resp 16 | Ht 66.5 in | Wt 212.0 lb

## 2010-07-18 DIAGNOSIS — R7303 Prediabetes: Secondary | ICD-10-CM

## 2010-07-18 DIAGNOSIS — R7301 Impaired fasting glucose: Secondary | ICD-10-CM

## 2010-07-18 DIAGNOSIS — H6120 Impacted cerumen, unspecified ear: Secondary | ICD-10-CM

## 2010-07-18 DIAGNOSIS — R7309 Other abnormal glucose: Secondary | ICD-10-CM

## 2010-07-18 DIAGNOSIS — E785 Hyperlipidemia, unspecified: Secondary | ICD-10-CM

## 2010-07-18 DIAGNOSIS — E669 Obesity, unspecified: Secondary | ICD-10-CM

## 2010-07-18 NOTE — Patient Instructions (Signed)
CPE   End July.  Congrats on your new exercise routine, pls keep it up.  You need to be mindful of and reduce your carbohydrate intake,I will refer you to the nutionist at the hospital.  Your cholesterol is much better.  hBA1c in 4 months.

## 2010-08-07 DIAGNOSIS — H6120 Impacted cerumen, unspecified ear: Secondary | ICD-10-CM | POA: Insufficient documentation

## 2010-08-07 DIAGNOSIS — R7303 Prediabetes: Secondary | ICD-10-CM | POA: Insufficient documentation

## 2010-08-07 NOTE — Progress Notes (Signed)
  Subjective:    Patient ID: Kendra Wong, female    DOB: [birth year], 35 y.o.   MRN: [id number]  HPI The PT is here for follow up and re-evaluation of chronic medical conditions, and review of recent lab data.  Preventive health is updated, specifically  Cancer screening,  and Immunization.    There are no new concerns.  She c/o ear pain , and has a h/o recurrent cerumen impaction. Kendra Wong has been very diligent with exercise in the past several months, on avg she exercises at least 150 mins per week.She has also worked on her diet as far as fatty food is concerned      Review of Systems Denies recent fever or chills. Denies sinus pressure, nasal congestion,  or sore throat. Denies chest congestion, productive cough or wheezing. Denies chest pains, palpitations, paroxysmal nocturnal dyspnea, orthopnea and leg swelling Denies abdominal pain, nausea, vomiting,diarrhea or constipation.  Denies rectal bleeding or change in bowel movement. Denies dysuria, frequency, hesitancy or incontinence. Denies joint pain, swelling and limitation and mobility. Denies headaches, seizure, numbness, or tingling. Denies depression, anxiety or insomnia. Denies skin break down or rash.        Objective:   Physical Exam Patient alert and oriented and in no Cardiopulmonary distress.  HEENT: No facial asymmetry, EOMI, no sinus tenderness, TM occluded by impacted cerumen, Oropharynx pink and moist.  Neck supple no adenopathy.  Chest: Clear to auscultation bilaterally.  CVS: S1, S2 no murmurs, no S3.  ABD: Soft non tender. Bowel sounds normal.  Ext: No edema  MS: Adequate ROM spine, shoulders, hips and knees.  Skin: Intact, no ulcerations or rash noted.  Psych: Good eye contact, normal affect. Memory intact not anxious or depressed appearing.  CNS: CN 2-12 intact, power, tone and sensation normal throughout.        Assessment & Plan:  1. Cerumen impaction ear successfully  irrigated, no trauma to tympanic membrane, slight erythema of the outer ear. 2. Obesity: unchanged, referral to dietitian for help with caloric restriction 3. Hyperlipidemia: improved, pt applauded and encouraged to continue this trend 4.Prediabetes: deteriorated. Aggressive carbohydrate management , counselled to limit intake to 30 to 45 gm per meal

## 2010-10-17 ENCOUNTER — Telehealth: Payer: Self-pay | Admitting: Family Medicine

## 2010-10-17 ENCOUNTER — Other Ambulatory Visit: Payer: Self-pay | Admitting: Family Medicine

## 2010-10-17 DIAGNOSIS — M79673 Pain in unspecified foot: Secondary | ICD-10-CM

## 2010-10-17 NOTE — Telephone Encounter (Signed)
pls refer  Pt to foot doctor of her choice

## 2010-11-12 ENCOUNTER — Encounter: Payer: Self-pay | Admitting: Family Medicine

## 2010-11-19 ENCOUNTER — Ambulatory Visit: Payer: BC Managed Care – PPO | Admitting: Family Medicine

## 2010-11-22 ENCOUNTER — Encounter: Payer: Self-pay | Admitting: Family Medicine

## 2010-11-23 LAB — HEMOGLOBIN A1C: Hgb A1c MFr Bld: 6 % — ABNORMAL HIGH (ref <5.7)

## 2010-11-27 ENCOUNTER — Ambulatory Visit (INDEPENDENT_AMBULATORY_CARE_PROVIDER_SITE_OTHER): Payer: BC Managed Care – PPO | Admitting: Family Medicine

## 2010-11-27 ENCOUNTER — Encounter: Payer: Self-pay | Admitting: Family Medicine

## 2010-11-27 VITALS — BP 118/82 | HR 57 | Resp 16 | Ht 67.0 in | Wt 194.0 lb

## 2010-11-27 DIAGNOSIS — E785 Hyperlipidemia, unspecified: Secondary | ICD-10-CM

## 2010-11-27 DIAGNOSIS — R7303 Prediabetes: Secondary | ICD-10-CM

## 2010-11-27 DIAGNOSIS — R7309 Other abnormal glucose: Secondary | ICD-10-CM

## 2010-11-27 DIAGNOSIS — M79609 Pain in unspecified limb: Secondary | ICD-10-CM

## 2010-11-27 DIAGNOSIS — R5381 Other malaise: Secondary | ICD-10-CM

## 2010-11-27 DIAGNOSIS — R5383 Other fatigue: Secondary | ICD-10-CM

## 2010-11-27 DIAGNOSIS — M79671 Pain in right foot: Secondary | ICD-10-CM

## 2010-11-27 DIAGNOSIS — E669 Obesity, unspecified: Secondary | ICD-10-CM

## 2010-11-27 NOTE — Patient Instructions (Addendum)
Reschedule pap for middle of September or mid October  Please  F/U in 4 months   Fasting lipids and HBA1C in 4 months CBC, TSH and chem 7  Congrats on the 20 pound weight loss  You absolutely need to start carb counting, you WILL still enjoy your meals, just more power and control  We will give you inf on this  Xray of right foot  Advil 200mg  one 3 times daily for the next 5 days should help the pain

## 2010-11-27 NOTE — Progress Notes (Signed)
  Subjective:    Patient ID: Kendra Wong, female    DOB: 09/12/1975, 35 y.o.   MRN: [id number]  HPI The PT is here for follow up and re-evaluation of chronic medical conditions, medication management and review of any available recent lab and radiology data.  Preventive health is updated, specifically  Cancer screening and Immunization.  Pap was due today but on menses. C/o 1 month h/o right heel pain esp when she first awakens , also localized tender area on sole, no erythema , warmth or associated swelling.Also experiences right lower extremity pain at times, but is exercising rigorously. Has tender subcutaneous nodule on right shin which was red and larger but has significantly decreased in size.    Review of Systems Denies recent fever or chills. Denies sinus pressure, nasal congestion, ear pain or sore throat. Denies chest congestion, productive cough or wheezing. Denies chest pains, palpitations and leg swelling Denies abdominal pain, nausea, vomiting,diarrhea or constipation.   Denies dysuria, frequency, hesitancy or incontinence. Denies joint pain, swelling and limitation in mobility. Denies headaches, seizures, numbness, or tingling. Denies depression, anxiety or insomnia. Denies skin break down or rash.        Objective:   Physical Exam Patient alert and oriented and in no cardiopulmonary distress.  HEENT: No facial asymmetry, EOMI, no sinus tenderness,  oropharynx pink and moist.  Neck supple no adenopathy.  Chest: Clear to auscultation bilaterally.  CVS: S1, S2 no murmurs, no S3.  ABD: Soft non tender. Bowel sounds normal.  Ext: No edema  MS: Adequate ROM spine, shoulders, hips and knees.Tender over right heel  Skin: Intact, no ulcerations or rash noted.Pea sized non tender nodule on right shin  Psych: Good eye contact, normal affect. Memory intact not anxious or depressed appearing.  CNS: CN 2-12 intact, power, tone and sensation normal  throughout.        Assessment & Plan:   OBESITY Improved. Pt applauded on succesful weight loss through lifestyle change, and encouraged to continue same. Weight loss goal set for the next several months.   Prediabetes Unchanged , despite excellent weight loss, importance of carbohydrate management stressed, and pt encouraged to go to class  Foot pain, right Historically, sounds like a spur, will get x ray to further evaluate  HYPERLIPIDEMIA Low fat diet encouraged and discussed, rept labs in 4 months

## 2010-11-27 NOTE — Assessment & Plan Note (Signed)
Historically, sounds like a spur, will get x ray to further evaluate

## 2010-11-27 NOTE — Assessment & Plan Note (Signed)
Low fat diet encouraged and discussed, rept labs in 4 months

## 2010-11-27 NOTE — Assessment & Plan Note (Signed)
Improved. Pt applauded on succesful weight loss through lifestyle change, and encouraged to continue same. Weight loss goal set for the next several months.  

## 2010-11-27 NOTE — Assessment & Plan Note (Signed)
Unchanged , despite excellent weight loss, importance of carbohydrate management stressed, and pt encouraged to go to class

## 2010-12-26 ENCOUNTER — Encounter: Payer: BC Managed Care – PPO | Admitting: Family Medicine

## 2011-02-14 ENCOUNTER — Encounter: Payer: Self-pay | Admitting: Family Medicine

## 2011-02-14 ENCOUNTER — Telehealth: Payer: Self-pay | Admitting: Family Medicine

## 2011-02-18 ENCOUNTER — Ambulatory Visit (INDEPENDENT_AMBULATORY_CARE_PROVIDER_SITE_OTHER): Payer: BC Managed Care – PPO | Admitting: Family Medicine

## 2011-02-18 ENCOUNTER — Other Ambulatory Visit (HOSPITAL_COMMUNITY)
Admission: RE | Admit: 2011-02-18 | Discharge: 2011-02-18 | Disposition: A | Discharge status: Home / Self Care | Payer: BC Managed Care – PPO | Source: Ambulatory Visit | Attending: Family Medicine | Admitting: Family Medicine

## 2011-02-18 ENCOUNTER — Encounter: Payer: Self-pay | Admitting: Family Medicine

## 2011-02-18 VITALS — BP 104/80 | HR 60 | Resp 16 | Ht 67.0 in | Wt 186.1 lb

## 2011-02-18 DIAGNOSIS — E669 Obesity, unspecified: Secondary | ICD-10-CM

## 2011-02-18 DIAGNOSIS — Z01419 Encounter for gynecological examination (general) (routine) without abnormal findings: Secondary | ICD-10-CM | POA: Insufficient documentation

## 2011-02-18 DIAGNOSIS — Z0000 Encounter for general adult medical examination without abnormal findings: Secondary | ICD-10-CM

## 2011-02-18 DIAGNOSIS — R7303 Prediabetes: Secondary | ICD-10-CM

## 2011-02-18 DIAGNOSIS — N760 Acute vaginitis: Secondary | ICD-10-CM

## 2011-02-18 DIAGNOSIS — R7309 Other abnormal glucose: Secondary | ICD-10-CM

## 2011-02-18 DIAGNOSIS — Z124 Encounter for screening for malignant neoplasm of cervix: Secondary | ICD-10-CM

## 2011-02-18 DIAGNOSIS — E663 Overweight: Secondary | ICD-10-CM

## 2011-02-18 NOTE — Telephone Encounter (Signed)
Patients next labs were due in Nov so advised to just wait until her appt

## 2011-02-18 NOTE — Patient Instructions (Signed)
F/U first week in May.  Congrats on weight loss through healthy lifestyle changes.   Fasting labs first week in December.  Non fasting HBa1C end April for May visit.  Weight loss goal 64f 12 to 15 pounds in the next 6 months.  It is important that you exercise regularly at least 60 minutes 5 times a week. If you develop chest pain, have severe difficulty breathing, or feel very tired, stop exercising immediately and seek medical attention  A healthy diet is rich in fruit, vegetables and whole grains. Poultry fish, nuts and beans are a healthy choice for protein rather then red meat. A low sodium diet and drinking 64 ounces of water daily is generally recommended. Oils and sweet should be limited. Carbohydrates especially for those who are diabetic or overweight, should be limited to 04-45 gram per meal. It is important to eat on a regular schedule, at least 3 times daily. Snacks should be primarily fruits, vegetables or nuts.

## 2011-02-20 LAB — WET PREP BY MOLECULAR PROBE
Candida species: NEGATIVE
Trichomonas vaginosis: NEGATIVE

## 2011-02-22 ENCOUNTER — Encounter: Payer: Self-pay | Admitting: *Deleted

## 2011-03-04 NOTE — Progress Notes (Signed)
  Subjective:    Patient ID: Kendra Wong, female    DOB: 02-01-1976, 35 y.o.   MRN: [id number]  HPI The PT is here for annual exam and review of any available recent lab and radiology data.  Preventive health is updated, specifically  Cancer screening and Immunization.    There are no new concerns.  There are no specific complaints       Review of Systems Denies recent fever or chills. Denies sinus pressure, nasal congestion, ear pain or sore throat. Denies chest congestion, productive cough or wheezing. Denies chest pains, palpitations and leg swelling Denies abdominal pain, nausea, vomiting,diarrhea or constipation.   Denies dysuria, frequency, hesitancy or incontinence. Denies joint pain, swelling and limitation in mobility. Denies headaches, seizures, numbness, or tingling. Denies depression, anxiety or insomnia. Denies skin break down or rash.       Objective:   Physical Exam Pleasant well nourished female, alert and oriented x 3, in no cardio-pulmonary distress. Afebrile. HEENT No facial trauma or asymetry. Sinuses non tender.  EOMI, PERTL, fundoscopic exam is normal, no hemorhage or exudate.  External ears normal, tympanic membranes clear. Oropharynx moist, no exudate, good dentition. Neck: supple, no adenopathy,JVD or thyromegaly.No bruits.  Chest: Clear to ascultation bilaterally.No crackles or wheezes. Non tender to palpation  Breast: No asymetry,no masses. No nipple discharge or inversion. No axillary or supraclavicular adenopathy  Cardiovascular system; Heart sounds normal,  S1 and  S2 ,no S3.  No murmur, or thrill. Apical beat not displaced Peripheral pulses normal.  Abdomen: Soft, non tender, no organomegaly or masses. No bruits. Bowel sounds normal. No guarding, tenderness or rebound.   GU: External genitalia normal. No lesions. Vaginal canal normal.No discharge. Uterus normal size, no adnexal masses, no cervical motion or adnexal  tenderness.  Musculoskeletal exam: Full ROM of spine, hips , shoulders and knees. No deformity ,swelling or crepitus noted. No muscle wasting or atrophy.   Neurologic: Cranial nerves 2 to 12 intact. Power, tone ,sensation and reflexes normal throughout. No disturbance in gait. No tremor.  Skin: Intact, no ulceration, erythema , scaling or rash noted. Pigmentation normal throughout  Psych; Normal mood and affect. Judgement and concentration normal        Assessment & Plan:

## 2011-03-04 NOTE — Assessment & Plan Note (Signed)
Improved. Pt applauded on succesful weight loss through lifestyle change, and encouraged to continue same. Weight loss goal set for the next several months.  

## 2011-03-04 NOTE — Assessment & Plan Note (Signed)
HBA1C unchanged despite excellent weight loss through lifestyle change, pt encouraged to continue same

## 2011-03-27 ENCOUNTER — Encounter: Payer: Self-pay | Admitting: Family Medicine

## 2011-04-02 ENCOUNTER — Ambulatory Visit: Payer: BC Managed Care – PPO | Admitting: Family Medicine

## 2011-08-28 ENCOUNTER — Ambulatory Visit: Payer: BC Managed Care – PPO | Admitting: Family Medicine

## 2011-08-28 ENCOUNTER — Encounter: Payer: Self-pay | Admitting: Family Medicine

## 2011-08-29 ENCOUNTER — Ambulatory Visit: Payer: BC Managed Care – PPO | Admitting: Family Medicine

## 2011-08-29 LAB — BASIC METABOLIC PANEL
BUN: 12 mg/dL (ref 6–23)
Calcium: 8.8 mg/dL (ref 8.4–10.5)
Potassium: 4.4 mEq/L (ref 3.5–5.3)

## 2011-08-29 LAB — CBC WITH DIFFERENTIAL/PLATELET
Eosinophils Relative: 3 % (ref 0–5)
Hemoglobin: 12.2 g/dL (ref 12.0–15.0)
MCHC: 30.9 g/dL (ref 30.0–36.0)
Monocytes Absolute: 0.4 10*3/uL (ref 0.1–1.0)
Neutro Abs: 1.2 10*3/uL — ABNORMAL LOW (ref 1.7–7.7)
Neutrophils Relative %: 36 % — ABNORMAL LOW (ref 43–77)
Platelets: 230 10*3/uL (ref 150–400)
RDW: 15.5 % (ref 11.5–15.5)

## 2011-08-29 LAB — LIPID PANEL
Cholesterol: 180 mg/dL (ref 0–200)
HDL: 54 mg/dL (ref >39)
Total CHOL/HDL Ratio: 3.3 Ratio
VLDL: 11 mg/dL (ref 0–40)

## 2011-08-29 LAB — HEMOGLOBIN A1C
Hgb A1c MFr Bld: 5.6 % (ref <5.7)
Mean Plasma Glucose: 114 mg/dL (ref <117)

## 2011-09-02 ENCOUNTER — Encounter: Payer: Self-pay | Admitting: Family Medicine

## 2011-09-02 ENCOUNTER — Ambulatory Visit (INDEPENDENT_AMBULATORY_CARE_PROVIDER_SITE_OTHER): Payer: BC Managed Care – PPO | Admitting: Family Medicine

## 2011-09-02 VITALS — BP 122/84 | HR 75 | Resp 18 | Ht 67.0 in | Wt 174.1 lb

## 2011-09-02 DIAGNOSIS — R7303 Prediabetes: Secondary | ICD-10-CM

## 2011-09-02 DIAGNOSIS — R7309 Other abnormal glucose: Secondary | ICD-10-CM

## 2011-09-02 DIAGNOSIS — E785 Hyperlipidemia, unspecified: Secondary | ICD-10-CM

## 2011-09-02 DIAGNOSIS — IMO0002 Reserved for concepts with insufficient information to code with codable children: Secondary | ICD-10-CM | POA: Insufficient documentation

## 2011-09-02 MED ORDER — MUPIROCIN 2 % EX OINT: TOPICAL_OINTMENT | CUTANEOUS | Status: AC

## 2011-09-02 MED ORDER — DOXYCYCLINE HYCLATE 100 MG PO TABS: 100.0000 mg | ORAL_TABLET | Freq: Two times a day (BID) | ORAL | Status: AC

## 2011-09-02 MED ORDER — FLUCONAZOLE 150 MG PO TABS: ORAL_TABLET | ORAL | Status: AC

## 2011-09-02 NOTE — Assessment & Plan Note (Signed)
cyst on mons pubis, diameter approx 2.5 cm, non tender, no purulent exudate currently.

## 2011-09-02 NOTE — Assessment & Plan Note (Signed)
Normal HBA1C with lifestyle change involving regular exercise , dietary change and weight loss!

## 2011-09-02 NOTE — Progress Notes (Signed)
  Subjective:    Patient ID: Kendra Wong, female    DOB: Jan 14, 1976, 36 y.o.   MRN: [id number]  HPI The PT is here for follow up and re-evaluation of chronic medical conditions, medication management and review of any available recent lab and radiology data.  Preventive health is updated, specifically  Cancer screening and Immunization.    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 I advise to plan limited amt of sweet at set times     Review of Systems See HPI Denies recent fever or chills. Denies sinus pressure, nasal congestion, ear pain or sore throat. Denies chest congestion, productive cough or wheezing. Denies chest pains, palpitations and leg swelling Denies abdominal pain, nausea, vomiting,diarrhea or constipation.   Denies dysuria, frequency, hesitancy or incontinence. Denies joint pain, swelling and limitation in mobility. Denies headaches, seizures, numbness, or tingling. Denies depression, anxiety or insomnia.        Objective:   Physical Exam Patient alert and oriented and in no cardiopulmonary distress.  HEENT: No facial asymmetry, EOMI, no sinus tenderness,  oropharynx pink and moist.  Neck supple no adenopathy.  Chest: Clear to auscultation bilaterally.  CVS: S1, S2 no murmurs, no S3.  ABD: Soft non tender. Bowel sounds normal.  Ext: No edema  MS: Adequate ROM spine, shoulders, hips and knees.  Skin: cyst on mons approx diameter 2.5 cm  Psych: Good eye contact, normal affect. Memory intact not anxious or depressed appearing.  CNS: CN 2-12 intact, power, tone and sensation normal throughout.        Assessment & Plan:

## 2011-09-02 NOTE — Patient Instructions (Addendum)
CPE in November  Congrats on weight loss and healthy habits, keep it up.  Your blood sugars are now normalized.   Medication is sent in for the cyst which you have. This will not totally go away without removal.  Please do not shave

## 2011-09-02 NOTE — Assessment & Plan Note (Signed)
Improved though lDL still increased, low fat diet discussed and encouraged

## 2011-12-23 ENCOUNTER — Ambulatory Visit: Payer: BC Managed Care – PPO | Admitting: Family Medicine

## 2011-12-23 ENCOUNTER — Encounter: Payer: Self-pay | Admitting: Family Medicine

## 2011-12-24 ENCOUNTER — Encounter: Payer: Self-pay | Admitting: Family Medicine

## 2011-12-24 ENCOUNTER — Ambulatory Visit (INDEPENDENT_AMBULATORY_CARE_PROVIDER_SITE_OTHER): Payer: BC Managed Care – PPO | Admitting: Family Medicine

## 2011-12-24 VITALS — BP 118/82 | HR 58 | Temp 98.6°F | Resp 16 | Ht 67.0 in | Wt 171.0 lb

## 2011-12-24 DIAGNOSIS — J069 Acute upper respiratory infection, unspecified: Secondary | ICD-10-CM

## 2011-12-24 DIAGNOSIS — J029 Acute pharyngitis, unspecified: Secondary | ICD-10-CM

## 2011-12-24 MED ORDER — AMOXICILLIN 500 MG PO CAPS: 500.0000 mg | ORAL_CAPSULE | Freq: Two times a day (BID) | ORAL | Status: AC

## 2011-12-24 NOTE — Progress Notes (Signed)
  Subjective:    Patient ID: Kendra Wong, female    DOB: [birth year], 36 y.o.   MRN: [id number]  HPI Patient presents with sore throat for the past 2 and half weeks. She began with an itchy throat which then progressed to being hoarse and sore with swallowing. She had headache associated but then developed cough. Cough is now improving. Her sore throat improved however returned a few days later. She's tried over-the-counter medications which have not getting complete relief.. She denies any sick contacts. She does admit to fever and chills during the first week of her illness.   Review of Systems  GEN- denies fatigue, fever, weight loss,weakness, recent illness HEENT- denies eye drainage, change in vision, nasal discharge, CVS- denies chest pain, palpitations RESP- denies SOB, +cough, wheeze ABD- denies N/V, change in stools, abd pain GU- denies dysuria, hematuria, dribbling, incontinence MSK- denies joint pain, muscle aches, injury Neuro- denies headache, dizziness, syncope, seizure activity      Objective:   Physical Exam GEN- NAD, alert and oriented x3 HEENT- PERRL, EOMI, non injected sclera, pink conjunctiva, MMM, oropharynx injected, +exduates, shotty LAD, TM obscured by wax  Neck- Supple,  CVS- RRR, no murmur RESP-CTAB ABD-NABS,soft,NT,ND EXT- No edema Pulses- Radial, DP- 2+        Assessment & Plan:   URI- now resolving, with recurrent pharyngitis   Pharyngitis- based on exam, will treat for bacterial infection, with antibiotic x 10 days

## 2011-12-24 NOTE — Patient Instructions (Signed)
I am treating for bacterial pharyngitis  Start the antibiotics and take as prescribed Continue fluids Warm tea and salt water gargles

## 2012-03-09 ENCOUNTER — Encounter: Payer: BC Managed Care – PPO | Admitting: Family Medicine

## 2012-08-26 ENCOUNTER — Telehealth: Payer: Self-pay | Admitting: Family Medicine

## 2012-08-26 NOTE — Telephone Encounter (Signed)
Appointment made

## 2012-08-26 NOTE — Telephone Encounter (Signed)
Needs appt

## 2012-08-31 NOTE — Telephone Encounter (Signed)
Left message to call back  

## 2012-09-07 ENCOUNTER — Ambulatory Visit (INDEPENDENT_AMBULATORY_CARE_PROVIDER_SITE_OTHER): Payer: BC Managed Care – PPO | Admitting: Family Medicine

## 2012-09-07 ENCOUNTER — Encounter: Payer: Self-pay | Admitting: Family Medicine

## 2012-09-07 VITALS — BP 128/72 | HR 60 | Resp 18 | Ht 67.0 in | Wt 172.0 lb

## 2012-09-07 DIAGNOSIS — J302 Other seasonal allergic rhinitis: Secondary | ICD-10-CM | POA: Insufficient documentation

## 2012-09-07 DIAGNOSIS — H6120 Impacted cerumen, unspecified ear: Secondary | ICD-10-CM

## 2012-09-07 DIAGNOSIS — R7309 Other abnormal glucose: Secondary | ICD-10-CM

## 2012-09-07 DIAGNOSIS — R7303 Prediabetes: Secondary | ICD-10-CM

## 2012-09-07 DIAGNOSIS — E785 Hyperlipidemia, unspecified: Secondary | ICD-10-CM

## 2012-09-07 DIAGNOSIS — H9209 Otalgia, unspecified ear: Secondary | ICD-10-CM

## 2012-09-07 DIAGNOSIS — H9203 Otalgia, bilateral: Secondary | ICD-10-CM

## 2012-09-07 DIAGNOSIS — H6123 Impacted cerumen, bilateral: Secondary | ICD-10-CM

## 2012-09-07 DIAGNOSIS — J309 Allergic rhinitis, unspecified: Secondary | ICD-10-CM

## 2012-09-07 MED ORDER — LORATADINE 10 MG PO TABS: 10.0000 mg | ORAL_TABLET | Freq: Every day | ORAL | Status: DC

## 2012-09-07 MED ORDER — SCOPOLAMINE 1 MG/3DAYS TD PT72: 1.0000 | MEDICATED_PATCH | TRANSDERMAL | Status: AC

## 2012-09-07 NOTE — Progress Notes (Signed)
  Subjective:    Patient ID: Kendra Wong, female    DOB: 16-Aug-1975, 37 y.o.   MRN: [id number]  HPI 1 month h/o bilateral ear pain, reduced drainage and crusting with drainage from both ears. She ahs been "cleaning" regularly including insertion of Qtips. Still exercises regularly. C/o increased allergy symptoms requests script for this  Needs med for sea sickness due to upcoming cruise   Review of Systems See HPI Denies recent fever or chills. Denies , ear pain or sore throat. Denies chest congestion, productive cough or wheezing. Denies chest pains, palpitations and leg swelling Denies abdominal pain, nausea, vomiting,diarrhea or constipation.   Denies dysuria, frequency, hesitancy or incontinence. Denies joint pain, swelling and limitation in mobility. Denies headaches, seizures, numbness, or tingling. Denies depression, anxiety or insomnia. Denies skin break down or rash.        Objective:   Physical Exam  Patient alert and oriented and in no cardiopulmonary distress.  HEENT: No facial asymmetry, EOMI,   oropharynx pink and moist.  Neck supple no adenopathy.Almost total occlusion of both tM with material which appears to contain cotton, reduced hearing. eryhtema and edema of nasal mucosa, slight frontalsinus tenderness with pressure  Chest: Clear to auscultation bilaterally.  CVS: S1, S2 no murmurs, no S3.  ABD: Soft non tender. Bowel sounds normal.  Ext: No edema  MS: Adequate ROM spine, shoulders, hips and knees.  Skin: Intact, hyperpiented and scaly skin in outer ear canal from repeated trauma  Psych: Good eye contact, normal affect. Memory intact not anxious or depressed appearing.  CNS: CN 2-12 intact, power, tone and sensation normal throughout.       Assessment & Plan:

## 2012-09-07 NOTE — Patient Instructions (Addendum)
CPE end October, call if you need me before.  You will be referred to ENT re ear pain and discharge   Meds to be handed to you at checkout are transcopolamine and loratidine.  Safe trip , and congrats re daughter's success.  Fasting lipid, chem 7, HBA1C and CBc end October   PLEASE STOP putting qtips in your ears

## 2012-09-07 NOTE — Assessment & Plan Note (Signed)
Updated lab for next visit /Patient educated about the importance of limiting  Carbohydrate intake , the need to commit to daily physical activity for a minimum of 30 minutes , and to commit weight loss. The fact that changes in all these areas will reduce or eliminate all together the development of diabetes is stressed.    

## 2012-09-07 NOTE — Assessment & Plan Note (Signed)
Chronic ear pain and drainage with foreign body in both ears, ENT eval

## 2012-09-07 NOTE — Assessment & Plan Note (Signed)
Uncontrolled , loratidineprescribed

## 2012-09-23 ENCOUNTER — Encounter: Payer: BC Managed Care – PPO | Admitting: Family Medicine

## 2012-12-10 ENCOUNTER — Ambulatory Visit (INDEPENDENT_AMBULATORY_CARE_PROVIDER_SITE_OTHER): Payer: BC Managed Care – PPO | Admitting: Otolaryngology

## 2013-02-08 ENCOUNTER — Encounter: Payer: BC Managed Care – PPO | Admitting: Family Medicine

## 2013-03-23 ENCOUNTER — Encounter: Payer: BC Managed Care – PPO | Admitting: Family Medicine

## 2013-05-07 ENCOUNTER — Telehealth: Payer: Self-pay | Admitting: Family Medicine

## 2013-05-10 ENCOUNTER — Encounter: Payer: BC Managed Care – PPO | Admitting: Family Medicine

## 2013-05-10 NOTE — Telephone Encounter (Signed)
Patient is aware 

## 2013-06-10 ENCOUNTER — Other Ambulatory Visit: Payer: Self-pay | Admitting: Family Medicine

## 2013-06-10 LAB — CBC
HCT: 39.9 % (ref 36.0–46.0)
Hemoglobin: 13.1 g/dL (ref 12.0–15.0)
MCH: 26.7 pg (ref 26.0–34.0)
MCHC: 32.8 g/dL (ref 30.0–36.0)
MCV: 81.3 fL (ref 78.0–100.0)
PLATELETS: 294 10*3/uL (ref 150–400)
RBC: 4.91 MIL/uL (ref 3.87–5.11)
RDW: 14.3 % (ref 11.5–15.5)
WBC: 3.7 10*3/uL — AB (ref 4.0–10.5)

## 2013-06-10 LAB — BASIC METABOLIC PANEL
BUN: 7 mg/dL (ref 6–23)
CALCIUM: 9.3 mg/dL (ref 8.4–10.5)
CHLORIDE: 99 meq/L (ref 96–112)
CO2: 31 meq/L (ref 19–32)
Creat: 0.7 mg/dL (ref 0.50–1.10)
Glucose, Bld: 83 mg/dL (ref 70–99)
Potassium: 4.2 mEq/L (ref 3.5–5.3)
SODIUM: 136 meq/L (ref 135–145)

## 2013-06-10 LAB — HEMOGLOBIN A1C
HEMOGLOBIN A1C: 5.4 % (ref <5.7)
Mean Plasma Glucose: 108 mg/dL (ref <117)

## 2013-06-10 LAB — LIPID PANEL
CHOL/HDL RATIO: 2.6 ratio
Cholesterol: 189 mg/dL (ref 0–200)
HDL: 72 mg/dL (ref >39)
LDL CALC: 109 mg/dL — AB (ref 0–99)
Triglycerides: 39 mg/dL (ref <150)
VLDL: 8 mg/dL (ref 0–40)

## 2013-06-15 ENCOUNTER — Encounter: Payer: BC Managed Care – PPO | Admitting: Family Medicine

## 2013-07-07 ENCOUNTER — Encounter: Payer: Self-pay | Admitting: Family Medicine

## 2013-07-07 ENCOUNTER — Ambulatory Visit (INDEPENDENT_AMBULATORY_CARE_PROVIDER_SITE_OTHER): Payer: BC Managed Care – PPO | Admitting: Family Medicine

## 2013-07-07 ENCOUNTER — Encounter (INDEPENDENT_AMBULATORY_CARE_PROVIDER_SITE_OTHER): Payer: Self-pay

## 2013-07-07 ENCOUNTER — Other Ambulatory Visit (HOSPITAL_COMMUNITY)
Admission: RE | Admit: 2013-07-07 | Discharge: 2013-07-07 | Disposition: A | Discharge status: Home / Self Care | Payer: BC Managed Care – PPO | Source: Ambulatory Visit | Attending: Family Medicine | Admitting: Family Medicine

## 2013-07-07 VITALS — BP 120/82 | HR 82 | Resp 16 | Wt 183.0 lb

## 2013-07-07 DIAGNOSIS — H6120 Impacted cerumen, unspecified ear: Secondary | ICD-10-CM

## 2013-07-07 DIAGNOSIS — Z124 Encounter for screening for malignant neoplasm of cervix: Secondary | ICD-10-CM

## 2013-07-07 DIAGNOSIS — D72819 Decreased white blood cell count, unspecified: Secondary | ICD-10-CM

## 2013-07-07 DIAGNOSIS — R7303 Prediabetes: Secondary | ICD-10-CM

## 2013-07-07 DIAGNOSIS — Z0000 Encounter for general adult medical examination without abnormal findings: Secondary | ICD-10-CM

## 2013-07-07 DIAGNOSIS — H6090 Unspecified otitis externa, unspecified ear: Secondary | ICD-10-CM

## 2013-07-07 DIAGNOSIS — E785 Hyperlipidemia, unspecified: Secondary | ICD-10-CM

## 2013-07-07 DIAGNOSIS — Z1151 Encounter for screening for human papillomavirus (HPV): Secondary | ICD-10-CM | POA: Insufficient documentation

## 2013-07-07 NOTE — Patient Instructions (Signed)
CPE in 12 month, call if you need me before  Blood work is improved, so I encourage you to resume regular exercise. You are referred to Dr Benjamine Mola re ear pain , and cerumen impaction   Sign up to my chart today please  Topical medication (abreva?) OTC available for cold sore  I hope your situation improves, call for any help when you need it

## 2013-07-09 ENCOUNTER — Telehealth: Payer: Self-pay

## 2013-07-09 NOTE — Telephone Encounter (Signed)
She will need to see a hematologist for further testing on this so if this is what she has decided on please enter referral dx low WBC or leukopenia (I had discussed this at O[facility], I have no additional tsts as a a primaery care Doc to order, further testing is by a hematologist)

## 2013-07-09 NOTE — Telephone Encounter (Signed)
Called and left voicemail asking for patient to call office if she would like to see hematologist.

## 2013-07-11 DIAGNOSIS — D72819 Decreased white blood cell count, unspecified: Secondary | ICD-10-CM | POA: Insufficient documentation

## 2013-07-11 DIAGNOSIS — Z0000 Encounter for general adult medical examination without abnormal findings: Secondary | ICD-10-CM | POA: Insufficient documentation

## 2013-07-11 NOTE — Assessment & Plan Note (Signed)
Improved, LDL still above goal Hyperlipidemia:Low fat diet discussed and encouraged.

## 2013-07-11 NOTE — Assessment & Plan Note (Signed)
Right cerumen impaction and severe otitis externa left worse than right refer to ENT for furhter management

## 2013-07-11 NOTE — Assessment & Plan Note (Signed)
Annual exam as documented. Counseling done  re healthy lifestyle involving commitment to 150 minutes exercise per week, heart healthy diet, and attaining healthy weight.The importance of adequate sleep also discussed. Regular seat belt use and safe storage  of firearms if patient has them, is also discussed. Changes in health habits are decided on by the patient with goals and time frames  set for achieving them. Immunization and cancer screening needs are specifically addressed at this visit.  

## 2013-07-11 NOTE — Assessment & Plan Note (Signed)
chronic intermittently , hb and platelets normal, pt advised likely benign variant of normal oK to follow unless wishes to see hematology about this. Advised MVI daily

## 2013-07-11 NOTE — Assessment & Plan Note (Signed)
Improved and normalized at this time, pt applauded on this

## 2013-07-11 NOTE — Progress Notes (Signed)
Subjective:    Patient ID: Kendra Wong, female    DOB: November 05, 1975, 38 y.o.   MRN: [id number]  HPI  The patient is for an annual exam. Health maintenance is reviewed and updated, specifically  recommended,  screening tests and immunizations.Refusess flu vaccine Recent lab and radiologic data is reviewed also. Increased personal stress due to ill health of her father in law, which her spouse is handling poorly. Pt tearful and overwhelmed, also has the anxiety of her older daughter in college, will continue to receive support from [facility] elder, no interest in professional therapy at this time, but may well need this in the future unless there is an improvement in her situation Frustrated with weight gain due to reduced exercise and will work on this   Review of Systems See HPI Denies recent fever or chills. Denies sinus pressure, nasal congestion, ear pain or sore throat. Denies chest congestion, productive cough or wheezing. Denies chest pains, palpitations and leg swelling Denies abdominal pain, nausea, vomiting,diarrhea or constipation.   Denies dysuria, frequency, hesitancy or incontinence. Denies joint pain, swelling and limitation in mobility. Denies headaches, seizures, numbness, or tingling.  Denies skin break down or rash.        Objective:   Physical Exam ,BP 120/82  Pulse 82  Resp 16  Wt 183 lb (83.008 kg)  SpO2 97%  LMP 06/16/2013 Pleasant well nourished female, alert and oriented x 3, in no cardio-pulmonary distress. Afebrile. HEENT No facial trauma or asymetry. Sinuses non tender.  EOMI, PERTL, fundoscopic exam is normal, no hemorhage or exudate.  External ears excessive flaking of external canals, hyperpigmented left external meatus, right TM occluded  Oropharynx moist, no exudate, good dentition. Neck: supple, no adenopathy,JVD or thyromegaly.No bruits.  Chest: Clear to ascultation bilaterally.No crackles or wheezes. Non tender to  palpation  Breast: No asymetry,no masses. No nipple discharge or inversion. No axillary or supraclavicular adenopathy  Cardiovascular system; Heart sounds normal,  S1 and  S2 ,no S3.  No murmur, or thrill. Apical beat not displaced Peripheral pulses normal.  Abdomen: Soft, non tender, no organomegaly or masses. No bruits. Bowel sounds normal. No guarding, tenderness or rebound.   GU: External genitalia normal. No lesions. Vaginal canal normal.No discharge. Uterus normal size, no adnexal masses, no cervical motion or adnexal tenderness.  Musculoskeletal exam: Full ROM of spine, hips , shoulders and knees. No deformity ,swelling or crepitus noted. No muscle wasting or atrophy.   Neurologic: Cranial nerves 2 to 12 intact. Power, tone ,sensation and reflexes normal throughout. No disturbance in gait. No tremor.  Skin: Intact, no ulceration, erythema , scaling or rash noted. Pigmentation normal throughout  Psych; Tearful and depressed mood with poor eye contact, overwhelmed with current family stresses, trying to cope as best as she can       Assessment & Plan:  Impacted cerumen Right cerumen impaction and severe otitis externa left worse than right refer to ENT for furhter management  Leukopenia chronic intermittently , hb and platelets normal, pt advised likely benign variant of normal oK to follow unless wishes to see hematology about this. Advised MVI daily  Routine general medical examination at a health care facility Annual exam as documented. Counseling done  re healthy lifestyle involving commitment to 150 minutes exercise per week, heart healthy diet, and attaining healthy weight.The importance of adequate sleep also discussed. Regular seat belt use and safe storage  of firearms if patient has them, is also discussed. Changes in health habits are  decided on by the patient with goals and time frames  set for achieving them. Immunization and cancer screening  needs are specifically addressed at this visit.   Prediabetes Improved and normalized at this time, pt applauded on this  HYPERLIPIDEMIA Improved, LDL still above goal Hyperlipidemia:Low fat diet discussed and encouraged.

## 2014-02-03 ENCOUNTER — Ambulatory Visit (INDEPENDENT_AMBULATORY_CARE_PROVIDER_SITE_OTHER): Payer: BC Managed Care – PPO | Admitting: Otolaryngology

## 2014-02-03 DIAGNOSIS — H608X3 Other otitis externa, bilateral: Secondary | ICD-10-CM

## 2014-02-03 DIAGNOSIS — H6123 Impacted cerumen, bilateral: Secondary | ICD-10-CM

## 2014-06-07 ENCOUNTER — Encounter: Payer: Self-pay | Admitting: Family Medicine

## 2014-06-07 ENCOUNTER — Ambulatory Visit (INDEPENDENT_AMBULATORY_CARE_PROVIDER_SITE_OTHER): Payer: BC Managed Care – PPO | Admitting: Family Medicine

## 2014-06-07 VITALS — BP 134/84 | HR 64 | Resp 16 | Ht 67.0 in | Wt 186.0 lb

## 2014-06-07 DIAGNOSIS — E785 Hyperlipidemia, unspecified: Secondary | ICD-10-CM

## 2014-06-07 DIAGNOSIS — R7309 Other abnormal glucose: Secondary | ICD-10-CM

## 2014-06-07 DIAGNOSIS — J209 Acute bronchitis, unspecified: Secondary | ICD-10-CM | POA: Insufficient documentation

## 2014-06-07 DIAGNOSIS — R7303 Prediabetes: Secondary | ICD-10-CM

## 2014-06-07 DIAGNOSIS — J029 Acute pharyngitis, unspecified: Secondary | ICD-10-CM

## 2014-06-07 LAB — POCT RAPID STREP A (OFFICE): Rapid Strep A Screen: NEGATIVE

## 2014-06-07 MED ORDER — BENZONATATE 100 MG PO CAPS: 100.0000 mg | ORAL_CAPSULE | Freq: Two times a day (BID) | ORAL | Status: DC | PRN

## 2014-06-07 MED ORDER — AZITHROMYCIN 250 MG PO TABS: ORAL_TABLET | ORAL | Status: AC

## 2014-06-07 MED ORDER — FLUCONAZOLE 150 MG PO TABS: 150.0000 mg | ORAL_TABLET | Freq: Once | ORAL | Status: DC

## 2014-06-07 NOTE — Patient Instructions (Addendum)
You are treated for acute bronchitis  Rapid strep test is negative  Decongestant tablets and a 5 day antibiotic course is prescribed and sent to your pharmacy  Fluconazole tablet script is given IN THE EVENT that you develop a vaginal yeast infection after the antibiotic, fill ONLY if needed  You are in my thoughts at this difficult time Fasting lipid, chem 7 , TSH, HBA1C ,CBC as soon as possible  F/U in 6.5 month  Work on regular exercise ,healthy diet and achieving healthy weight

## 2014-06-07 NOTE — Progress Notes (Signed)
Subjective:    Patient ID: Kendra Wong, female    DOB: 1976/03/13, 39 y.o.   MRN: [id number]  HPI Had temperature 1 week ago, none after that, had chills and excessive fatigue for 1 day No other symptoms except dry hacking cough, which is primarily at night, non productive, took nyquil for 2 days. Sore throat today, sore neck glands and left ear pressure, per routine No urinary symptoms,or GI symptoms. Concerned as she has a dear family member her grandmother who raised her, very ill in the hospital and she does not want to expose her to infection yet wants to visit    Review of Systems See HPI  Denies chest pains, palpitations and leg swelling Denies abdominal pain, nausea, vomiting,diarrhea or constipation.   Denies dysuria, frequency, hesitancy or incontinence. Denies joint pain, swelling and limitation in mobility. Denies headaches, seizures, numbness, or tingling. Denies depression, does have increased  anxiety and insomnia. Denies skin break down or rash.        Objective:   Physical Exam BP 134/84 mmHg  Pulse 64  Resp 16  Ht 5\' 7"  (1.702 m)  Wt 186 lb (84.369 kg)  BMI 29.12 kg/m2  SpO2 98% Patient alert and oriented and in no cardiopulmonary distress.  HEENT: No facial asymmetry, EOMI,   oropharynx pink and moist.  Neck supple no JVD, no mass.No sinus tenderness, TM clear bilaterally, left anterior cervical adenitis  Chest: adequate air entry , few crackles ,no wheezes CVS: S1, S2 no murmurs, no S3.Regular rate.  ABD: Soft non tender.   Ext: No edema  MS: Adequate ROM spine, shoulders, hips and knees.  Skin: Intact, no ulcerations or rash noted.  Psych: Good eye contact, normal affect. Memory intact not anxious or depressed appearing.  CNS: CN 2-12 intact, power,  normal throughout.no focal deficits noted.        Assessment & Plan:  Acute bronchitis Antibiotic prescribed, pt to use OTC decongestant as before Fluconazole script provided in  the event of yeast vaginitis   Hyperlipidemia LDL goal <100 Hyperlipidemia:Low fat diet discussed and encouraged.   Lipid Panel  Lab Results  Component Value Date   CHOL 189 06/10/2013   HDL 72 06/10/2013   LDLCALC 109* 06/10/2013   TRIG 39 06/10/2013   CHOLHDL 2.6 06/10/2013      Updated lab needed at/ before next visit.    Prediabetes Patient educated about the importance of limiting  Carbohydrate intake , the need to commit to daily physical activity for a minimum of 30 minutes , and to commit weight loss. The fact that changes in all these areas will reduce or eliminate all together the development of diabetes is stressed.  Updated lab needed at/ before next visit.   Diabetic Labs Latest Ref Rng 06/10/2013 08/28/2011 11/22/2010 07/17/2010 12/14/2009  HbA1c <5.7 % 5.4 5.6 6.0(H) 6.0(H) 5.7(H)  Chol 0 - 200 mg/dL 189 180 - 188 203(H)  HDL >39 mg/dL 72 54 - 56 53  Calc LDL 0 - 99 mg/dL 109(H) 115(H) - 112(H) 136(H)  Triglycerides <150 mg/dL 39 56 - 99 68  Creatinine 0.50 - 1.10 mg/dL 0.70 0.84 - - 0.93   BP/Weight 06/07/2014 07/07/2013 09/07/2012 12/24/2011 09/02/2011 02/18/2011 7/68/0881  Systolic BP 103 159 458 592 924 462 863  Diastolic BP 84 82 72 82 84 80 82  Wt. (Lbs) 186 183 172.04 171 174.12 186.12 194  BMI 29.12 28.66 26.94 26.78 27.26 29.14 30.38   No flowsheet data found.  Sore throat Symptomatic, exam normal and rapid strep test is negative

## 2014-08-07 DIAGNOSIS — J029 Acute pharyngitis, unspecified: Secondary | ICD-10-CM | POA: Insufficient documentation

## 2014-08-07 NOTE — Assessment & Plan Note (Signed)
Antibiotic prescribed, pt to use OTC decongestant as before Fluconazole script provided in the event of yeast vaginitis

## 2014-08-07 NOTE — Assessment & Plan Note (Signed)
Hyperlipidemia:Low fat diet discussed and encouraged.   Lipid Panel  Lab Results  Component Value Date   CHOL 189 06/10/2013   HDL 72 06/10/2013   LDLCALC 109* 06/10/2013   TRIG 39 06/10/2013   CHOLHDL 2.6 06/10/2013      Updated lab needed at/ before next visit.

## 2014-08-07 NOTE — Assessment & Plan Note (Addendum)
Patient educated about the importance of limiting  Carbohydrate intake , the need to commit to daily physical activity for a minimum of 30 minutes , and to commit weight loss. The fact that changes in all these areas will reduce or eliminate all together the development of diabetes is stressed.  Updated lab needed at/ before next visit.   Diabetic Labs Latest Ref Rng 06/10/2013 08/28/2011 11/22/2010 07/17/2010 12/14/2009  HbA1c <5.7 % 5.4 5.6 6.0(H) 6.0(H) 5.7(H)  Chol 0 - 200 mg/dL 189 180 - 188 203(H)  HDL >39 mg/dL 72 54 - 56 53  Calc LDL 0 - 99 mg/dL 109(H) 115(H) - 112(H) 136(H)  Triglycerides <150 mg/dL 39 56 - 99 68  Creatinine 0.50 - 1.10 mg/dL 0.70 0.84 - - 0.93   BP/Weight 06/07/2014 07/07/2013 09/07/2012 12/24/2011 09/02/2011 02/18/2011 9/97/7414  Systolic BP 239 532 023 343 568 616 837  Diastolic BP 84 82 72 82 84 80 82  Wt. (Lbs) 186 183 172.04 171 174.12 186.12 194  BMI 29.12 28.66 26.94 26.78 27.26 29.14 30.38   No flowsheet data found.

## 2014-08-07 NOTE — Assessment & Plan Note (Signed)
Symptomatic, exam normal and rapid strep test is negative

## 2014-08-23 ENCOUNTER — Telehealth: Payer: Self-pay | Admitting: Family Medicine

## 2014-08-25 NOTE — Telephone Encounter (Signed)
Ok to refer.

## 2014-08-25 NOTE — Addendum Note (Signed)
Addended by: Eual Fines on: 08/25/2014 03:54 PM   Modules accepted: Orders

## 2014-08-25 NOTE — Telephone Encounter (Signed)
Appointment 5.2.16 at 10:30 with Dr Berle Mull at [facility] the number is [phone] patient is aware to arrive at 10:15
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
## 2014-08-25 NOTE — Telephone Encounter (Signed)
Done

## 2014-09-08 ENCOUNTER — Telehealth: Payer: Self-pay | Admitting: *Deleted

## 2014-09-08 NOTE — Telephone Encounter (Signed)
Pt called and scheduled a appt for Monday at 11 for lower back pain, pt states her lower back really hurts even when she sneeze the pain goes down to her lower back, pt states it hurts to bend down, pt said she is not sure if she has pulled the muscle, pt also said a retired [employer] scientist told her that it could be her kidneys. Please advise

## 2014-09-08 NOTE — Telephone Encounter (Signed)
Tired to call patient back to get more info but her phone kept breaking up and I couldn't understand her and we got disconnected and she never called back. Appt has been made for Monday

## 2014-09-08 NOTE — Telephone Encounter (Signed)
Recommend tylenol 325 mg twice daily as needed till seen, sounds like sciatica but this is new for her

## 2014-09-08 NOTE — Telephone Encounter (Signed)
States it only hurts when she bends over or sits down in her car seat or coughs/sneezes. Tried to call her back twice for more info but her phone kept cutting off right after she started talking each time and she hasn't called back

## 2014-09-09 NOTE — Telephone Encounter (Signed)
Left message for patient

## 2014-09-12 ENCOUNTER — Ambulatory Visit (INDEPENDENT_AMBULATORY_CARE_PROVIDER_SITE_OTHER): Payer: BLUE CROSS/BLUE SHIELD | Admitting: Family Medicine

## 2014-09-12 ENCOUNTER — Encounter: Payer: Self-pay | Admitting: Family Medicine

## 2014-09-12 VITALS — BP 128/84 | HR 96 | Temp 98.6°F | Resp 16 | Ht 67.0 in | Wt 185.0 lb

## 2014-09-12 DIAGNOSIS — J209 Acute bronchitis, unspecified: Secondary | ICD-10-CM | POA: Diagnosis not present

## 2014-09-12 DIAGNOSIS — Z1159 Encounter for screening for other viral diseases: Secondary | ICD-10-CM

## 2014-09-12 DIAGNOSIS — E669 Obesity, unspecified: Secondary | ICD-10-CM | POA: Insufficient documentation

## 2014-09-12 DIAGNOSIS — J0100 Acute maxillary sinusitis, unspecified: Secondary | ICD-10-CM

## 2014-09-12 DIAGNOSIS — M545 Low back pain, unspecified: Secondary | ICD-10-CM

## 2014-09-12 DIAGNOSIS — E663 Overweight: Secondary | ICD-10-CM

## 2014-09-12 MED ORDER — SULFAMETHOXAZOLE-TRIMETHOPRIM 800-160 MG PO TABS: 1.0000 | ORAL_TABLET | Freq: Two times a day (BID) | ORAL | Status: DC

## 2014-09-12 MED ORDER — PREDNISONE 5 MG PO TABS: 5.0000 mg | ORAL_TABLET | Freq: Two times a day (BID) | ORAL | Status: AC

## 2014-09-12 MED ORDER — PROMETHAZINE-DM 6.25-15 MG/5ML PO SYRP: ORAL_SOLUTION | ORAL | Status: DC

## 2014-09-12 NOTE — Progress Notes (Signed)
   Subjective:    Patient ID: Kendra Wong, female    DOB: 27-Aug-1975, 39 y.o.   MRN: [id number]  HPI 5 day h/o LBP which started after she bent, felt as though tere was a "catch". Pain is now aggravated by bending and also experienced when she sits in her car, unable to get up into truck since symptom onset. 1 week h/o worsening head and chest congestion, associated with fever and chills intermittently. Nasal drainage has thickened , and is yellowish green, and at times bloody. Sputum is thick and yellow. C/o bilateral ear pressure, denies hearing loss and sore throat. Increasing fatigue , poor appetitie and sleep disturbed by cough. No improvement with OTC medication.    Review of Systems See HPI Denies chest pains, palpitations and leg swelling Denies abdominal pain, nausea, vomiting,diarrhea or constipation.   Denies dysuria, frequency, hesitancy or incontinence. Denies headaches, seizures, numbness, or tingling. Denies depression, anxiety or insomnia. Denies skin break down or rash.        Objective:   Physical Exam BP 128/84 mmHg  Pulse 96  Temp(Src) 98.6 F (37 C) (Oral)  Resp 16  Ht 5\' 7"  (1.702 m)  Wt 185 lb (83.915 kg)  BMI 28.97 kg/m2  SpO2 99% Patient alert and oriented and in no cardiopulmonary distress.  HEENT: No facial asymmetry, EOMI,   oropharynx pink and moist.  Neck supple no JVD, no mass. Right maxillary sinus tender, right anterior cervical adenitis, TM clear bilaterally. Severe nasal congestion, eryhtematous and edematous Chest: Adequate air entry bilaterally , bronchial breath sounds in right base, no wheze  CVS: S1, S2 no murmurs, no S3.Regular rate.  ABD: Soft non tender.   Ext: No edema  MS: Adequate ROM spine, shoulders, hips and knees.  Skin: Intact, no ulcerations or rash noted.  Psych: Good eye contact, normal affect. Memory intact not anxious or depressed appearing.  CNS: CN 2-12 intact, power,  normal throughout.no focal  deficits noted.      Assessment & Plan:  Acute maxillary sinusitis Acute infection, antibiotic  And cough suppressant prescribed   Acute bronchitis Excess cough and congestion, prednisone for 5 days and cough suppressant prescribed   LBP (low back pain) Acute onset aggravated by bending, aleve and short course prednisone   Overweight (BMI 25.0-29.9) Deteriorated. Patient re-educated about  the importance of commitment to a  minimum of 150 minutes of exercise per week.  The importance of healthy food choices with portion control discussed. Encouraged to start a food diary, count calories and to consider  joining a support group. Sample diet sheets offered. Goals set by the patient for the next several months.   Weight /BMI 09/12/2014 06/07/2014 07/07/2013  WEIGHT 185 lb 186 lb 183 lb  HEIGHT 5\' 7"  5\' 7"  -  BMI 28.97 kg/m2 29.12 kg/m2 28.66 kg/m2    Current exercise per week *100** minutes.

## 2014-09-12 NOTE — Patient Instructions (Addendum)
Annual physical  in 5 month, call if you need me before  You are treated with 3 meds for acute maxillary sinusitis and bronchitis.  For the acute back pain on bending, take one alleve twice daily for 3 days , and I have also prescribed prednisone twice daily fo 5 days which will help both the back pain and excessive nasal congestion  Do saline nasal flushes twice daily also please  It is important that you exercise regularly at least 30 minutes 5 times a week. If you develop chest pain, have severe difficulty breathing, or feel very tired, stop exercising immediately and seek medical attention   A healthy diet is rich in fruit, vegetables and whole grains. Poultry fish, nuts and beans are a healthy choice for protein rather then red meat. A low sodium diet and drinking 64 ounces of water daily is generally recommended. Oils and sweet should be limited. Carbohydrates especially for those who are diabetic or overweight, should be limited to 60-45 gram per meal. It is important to eat on a regular schedule, at least 3 times daily. Snacks should be primarily fruits, vegetables or nuts.   Fasting labs in  6 to 8 weeks pls

## 2014-09-12 NOTE — Assessment & Plan Note (Signed)
Excess cough and congestion, prednisone for 5 days and cough suppressant prescribed

## 2014-09-12 NOTE — Assessment & Plan Note (Signed)
Acute infection, antibiotic  And cough suppressant prescribed

## 2014-09-12 NOTE — Assessment & Plan Note (Signed)
Acute onset aggravated by bending, aleve and short course prednisone

## 2014-09-12 NOTE — Assessment & Plan Note (Signed)
Deteriorated. Patient re-educated about  the importance of commitment to a  minimum of 150 minutes of exercise per week.  The importance of healthy food choices with portion control discussed. Encouraged to start a food diary, count calories and to consider  joining a support group. Sample diet sheets offered. Goals set by the patient for the next several months.   Weight /BMI 09/12/2014 06/07/2014 07/07/2013  WEIGHT 185 lb 186 lb 183 lb  HEIGHT 5\' 7"  5\' 7"  -  BMI 28.97 kg/m2 29.12 kg/m2 28.66 kg/m2    Current exercise per week *100** minutes.

## 2014-09-13 LAB — CBC
HCT: 39.4 % (ref 36.0–46.0)
HEMOGLOBIN: 12.7 g/dL (ref 12.0–15.0)
MCH: 26.7 pg (ref 26.0–34.0)
MCHC: 32.2 g/dL (ref 30.0–36.0)
MCV: 82.8 fL (ref 78.0–100.0)
MPV: 10.7 fL (ref 8.6–12.4)
Platelets: 263 10*3/uL (ref 150–400)
RBC: 4.76 MIL/uL (ref 3.87–5.11)
RDW: 14.6 % (ref 11.5–15.5)
WBC: 4.2 10*3/uL (ref 4.0–10.5)

## 2014-09-14 ENCOUNTER — Encounter: Payer: Self-pay | Admitting: Family Medicine

## 2014-09-14 LAB — LIPID PANEL
CHOLESTEROL: 163 mg/dL (ref 0–200)
HDL: 64 mg/dL (ref >=46)
LDL Cholesterol: 86 mg/dL (ref 0–99)
TRIGLYCERIDES: 64 mg/dL (ref <150)
Total CHOL/HDL Ratio: 2.5 Ratio
VLDL: 13 mg/dL (ref 0–40)

## 2014-09-14 LAB — BASIC METABOLIC PANEL
BUN: 12 mg/dL (ref 6–23)
CALCIUM: 8.9 mg/dL (ref 8.4–10.5)
CO2: 31 mEq/L (ref 19–32)
CREATININE: 0.81 mg/dL (ref 0.50–1.10)
Chloride: 98 mEq/L (ref 96–112)
GLUCOSE: 91 mg/dL (ref 70–99)
Potassium: 4.3 mEq/L (ref 3.5–5.3)
Sodium: 135 mEq/L (ref 135–145)

## 2014-09-14 LAB — HEMOGLOBIN A1C
Hgb A1c MFr Bld: 5.8 % — ABNORMAL HIGH (ref <5.7)
MEAN PLASMA GLUCOSE: 120 mg/dL — AB (ref <117)

## 2014-09-14 LAB — HIV ANTIBODY (ROUTINE TESTING W REFLEX): HIV: NONREACTIVE

## 2014-09-14 LAB — TSH: TSH: 0.915 u[IU]/mL (ref 0.350–4.500)

## 2014-09-23 ENCOUNTER — Telehealth: Payer: Self-pay

## 2014-09-23 NOTE — Telephone Encounter (Signed)
Started breaking out in a rash with 2 days left of her septra. Also having numbess in her hands in the am and leg cramps which were listed on the side effects packet with med. Advised not to take anymore of the med since she was feeling better and to use robitussin prn for the lingering cough. Will use benadryl cream on the rash on her leg and back and call for appt if it doesn't go away by Tuesday

## 2014-11-09 ENCOUNTER — Telehealth: Payer: Self-pay | Admitting: Family Medicine

## 2014-11-09 NOTE — Telephone Encounter (Signed)
Patient aware and said she had already spoken with someone and was aware of her results

## 2014-11-09 NOTE — Telephone Encounter (Signed)
Pls call her about la result sent since June , see result note sent to her which has not been read, she will want to hear , esp about the blood sugar

## 2015-02-16 ENCOUNTER — Encounter: Payer: Self-pay | Admitting: Family Medicine

## 2015-02-16 ENCOUNTER — Ambulatory Visit (INDEPENDENT_AMBULATORY_CARE_PROVIDER_SITE_OTHER): Payer: BLUE CROSS/BLUE SHIELD | Admitting: Family Medicine

## 2015-02-16 ENCOUNTER — Other Ambulatory Visit (HOSPITAL_COMMUNITY)
Admission: RE | Admit: 2015-02-16 | Discharge: 2015-02-16 | Disposition: A | Discharge status: Home / Self Care | Payer: BLUE CROSS/BLUE SHIELD | Source: Ambulatory Visit | Attending: Family Medicine | Admitting: Family Medicine

## 2015-02-16 VITALS — BP 112/74 | HR 76 | Resp 18 | Ht 67.0 in | Wt 196.1 lb

## 2015-02-16 DIAGNOSIS — Z124 Encounter for screening for malignant neoplasm of cervix: Secondary | ICD-10-CM

## 2015-02-16 DIAGNOSIS — Z0000 Encounter for general adult medical examination without abnormal findings: Secondary | ICD-10-CM

## 2015-02-16 DIAGNOSIS — Z01419 Encounter for gynecological examination (general) (routine) without abnormal findings: Secondary | ICD-10-CM | POA: Insufficient documentation

## 2015-02-16 NOTE — Progress Notes (Signed)
Patient is in for annual physical exam. No other health concerns are expressed or addressed at the visit. Recent labs, if available are reviewed. Immunization is reviewed , and  updated if needed.   ROS:  See HPI   PE:  BP 112/74 mmHg  Pulse 76  Resp 18  Ht 5\' 7"  (1.702 m)  Wt 196 lb 1.9 oz (88.959 kg)  BMI 30.71 kg/m2  SpO2 99%  LMP 02/16/2015 (Exact Date)    Pleasant well nourished female, alert and oriented x 3, in no cardio-pulmonary distress. Afebrile. HEENT No facial trauma or asymetry. Sinuses non tender.  Extra occullar muscles intact, pupils equally reactive to light. External ears normal, tympanic membranes clear. Oropharynx moist, no exudate, good dentition. Neck: supple, no adenopathy,JVD or thyromegaly.No bruits.  Chest: Clear to ascultation bilaterally.No crackles or wheezes. Non tender to palpation  Breast: No asymetry,no masses or lumps. No tenderness. No nipple discharge or inversion. No axillary or supraclavicular adenopathy  Cardiovascular system; Heart sounds normal,  S1 and  S2 ,no S3.  No murmur, or thrill. Apical beat not displaced Peripheral pulses normal.  Abdomen: Soft, non tender, no organomegaly or masses. No bruits. Bowel sounds normal. No guarding, tenderness or rebound.  Rectal:  Normal sphincter tone. No mass.No rectal masses.  Guaiac negative stool.  GU: External genitalia normal female genitalia , female distribution of hair. No lesions. Urethral meatus normal in size, no  Prolapse, no lesions visibly  Present. Bladder non tender. Vagina pink and moist , with no visible lesions , bloody drainage  present .Pt on cycle Adequate pelvic support no  cystocele or rectocele noted Cervix pink and appears healthy, cysts present on cervix, no lesions or ulcerations noted, no discharge noted from os Uterus normal size, no adnexal masses, no cervical motion or adnexal tenderness.   Musculoskeletal exam: Full ROM of spine, hips  , shoulders and knees. No deformity ,swelling or crepitus noted. No muscle wasting or atrophy.   Neurologic: Cranial nerves 2 to 12 intact. Power, tone ,sensation and reflexes normal throughout. No disturbance in gait. No tremor.  Skin: Intact, no ulceration, erythema , scaling or rash noted. Pigmentation normal throughout  Psych; Normal mood and affect. Judgement and concentration normal  A/P:  Annual physical exam Annual exam as documented. Counseling done  re healthy lifestyle involving commitment to 150 minutes exercise per week, heart healthy diet, and attaining healthy weight.The importance of adequate sleep also discussed. Regular seat belt use and home safety, is also discussed. Changes in health habits are decided on by the patient with goals and time frames  set for achieving them. Immunization and cancer screening needs are specifically addressed at this visit.

## 2015-02-16 NOTE — Patient Instructions (Signed)
F/u in 6 month, call if you need me before  Pap sent  Please work on good  health habits so that your health will improve. 1. Commitment to daily physical activity for 30 to 60  minutes, if you are able to do this.  2. Commitment to wise food choices. Aim for half of your  food intake to be vegetable and fruit, one quarter starchy foods, and one quarter protein. Try to eat on a regular schedule  3 meals per day, snacking between meals should be limited to vegetables or fruits or small portions of nuts. 64 ounces of water per day is generally recommended, unless you have specific health conditions, like heart failure or kidney failure where you will need to limit fluid intake.  3. Commitment to sufficient and a  good quality of physical and mental rest daily, generally between 6 to 8 hours per day.  WITH PERSISTANCE AND PERSEVERANCE, THE IMPOSSIBLE , BECOMES THE NORM!  Weight loss goal of 10 pounds in 5 months  HBA1C in 5.5 month

## 2015-02-16 NOTE — Assessment & Plan Note (Signed)

## 2015-02-21 LAB — CYTOLOGY - PAP

## 2015-04-26 NOTE — Progress Notes (Unsigned)
Glucose: Non- Fasting: 89 Taken by Samule Dry RN

## 2015-08-16 LAB — HEMOGLOBIN A1C
Hgb A1c MFr Bld: 5.4 % (ref <5.7)
MEAN PLASMA GLUCOSE: 108 mg/dL

## 2015-08-17 ENCOUNTER — Ambulatory Visit: Payer: BLUE CROSS/BLUE SHIELD | Admitting: Family Medicine

## 2015-08-17 ENCOUNTER — Ambulatory Visit (INDEPENDENT_AMBULATORY_CARE_PROVIDER_SITE_OTHER): Payer: BLUE CROSS/BLUE SHIELD | Admitting: Family Medicine

## 2015-08-17 ENCOUNTER — Encounter: Payer: Self-pay | Admitting: Family Medicine

## 2015-08-17 VITALS — BP 130/84 | HR 78 | Resp 16 | Ht 67.0 in | Wt 200.0 lb

## 2015-08-17 DIAGNOSIS — M779 Enthesopathy, unspecified: Secondary | ICD-10-CM | POA: Diagnosis not present

## 2015-08-17 DIAGNOSIS — E663 Overweight: Secondary | ICD-10-CM

## 2015-08-17 DIAGNOSIS — R7303 Prediabetes: Secondary | ICD-10-CM | POA: Diagnosis not present

## 2015-08-17 DIAGNOSIS — R319 Hematuria, unspecified: Secondary | ICD-10-CM | POA: Diagnosis not present

## 2015-08-17 DIAGNOSIS — E785 Hyperlipidemia, unspecified: Secondary | ICD-10-CM

## 2015-08-17 LAB — POCT URINALYSIS DIPSTICK
BILIRUBIN UA: NEGATIVE
Glucose, UA: NEGATIVE
LEUKOCYTES UA: NEGATIVE
NITRITE UA: NEGATIVE
PH UA: 5.5
PROTEIN UA: 100
RBC UA: NEGATIVE
Spec Grav, UA: 1.02
Urobilinogen, UA: 0.2

## 2015-08-17 LAB — POCT URINE PREGNANCY: Preg Test, Ur: NEGATIVE

## 2015-08-17 NOTE — Assessment & Plan Note (Signed)
Acute right ankle tendonitis x 4 days after rigorous exercise routine, advised 5 days of rest and anti inflammatories

## 2015-08-17 NOTE — Assessment & Plan Note (Signed)
Reports of recent blood in uirione , however , in house test negative for blood

## 2015-08-17 NOTE — Progress Notes (Signed)
   Subjective:    Patient ID: Kendra Wong, female    DOB: [birth year], 40 y.o.   MRN: [id number]  HPI Blood in urine twice since last night saw tissue, no pain, has no  dysuria or frequency, last period was 2 weeks prior and normal Hd been sexually active , denies dyspareunia or post coital bleeding in the past  Right ankle pain following boot camp, shoots to back of knee rated at a 5 , limited movement of ankle, localized tender area.  Has successfully worked on change in diet, with normalized blood sugar, weight unfortunately increased but that is her new challenge which she will  embrace     Review of Systems See HPI Denies recent fever or chills. Denies sinus pressure, nasal congestion, ear pain or sore throat. Denies chest congestion, productive cough or wheezing. Denies chest pains, palpitations and leg swelling Denies abdominal pain, nausea, vomiting,diarrhea or constipation.    Denies joint pain, swelling and limitation in mobility. Denies headaches, seizures, numbness, or tingling. Denies depression, anxiety or insomnia. Denies skin break down or rash.        Objective:   Physical Exam BP 130/84 mmHg  Pulse 78  Resp 16  Ht 5\' 7"  (1.702 m)  Wt 200 lb (90.719 kg)  BMI 31.32 kg/m2  SpO2 98% Patient alert and oriented and in no cardiopulmonary distress.  HEENT: No facial asymmetry, EOMI,   oropharynx pink and moist.  Neck supple no JVD, no mass.  Chest: Clear to auscultation bilaterally.  CVS: S1, S2 no murmurs, no S3.Regular rate.  ABD: Soft non tender.   Ext: No edema  MS: Adequate ROM spine, shoulders, hips and knees.Tender over posterior right ankle, no bleeding or bruising or swelling noted Skin: Intact, no ulcerations or rash noted.  Psych: Good eye contact, normal affect. Memory intact not anxious or depressed appearing.  CNS: CN 2-12 intact, power,  normal throughout.no focal deficits noted.        Assessment & Plan:  Tendonitis Acute  right ankle tendonitis x 4 days after rigorous exercise routine, advised 5 days of rest and anti inflammatories  Overweight (BMI 25.0-29.9) Deteriorated. Patient re-educated about  the importance of commitment to a  minimum of 150 minutes of exercise per week.  The importance of healthy food choices with portion control discussed. Encouraged to start a food diary, count calories and to consider  joining a support group. Sample diet sheets offered. Goals set by the patient for the next several months.   Weight /BMI 08/17/2015 02/16/2015 09/12/2014  WEIGHT 200 lb 196 lb 1.9 oz 185 lb  HEIGHT 5\' 7"  5\' 7"  5\' 7"   BMI 31.32 kg/m2 30.71 kg/m2 28.97 kg/m2    Current exercise per week over 150 mins   Prediabetes Corrected pt applauded on this, she will continue low carb diet and exercise. Will reduce caloric intake to attain weight loss  Hematuria Reports of recent blood in uirione , however , in house test negative for blood

## 2015-08-17 NOTE — Assessment & Plan Note (Signed)
Deteriorated. Patient re-educated about  the importance of commitment to a  minimum of 150 minutes of exercise per week.  The importance of healthy food choices with portion control discussed. Encouraged to start a food diary, count calories and to consider  joining a support group. Sample diet sheets offered. Goals set by the patient for the next several months.   Weight /BMI 08/17/2015 02/16/2015 09/12/2014  WEIGHT 200 lb 196 lb 1.9 oz 185 lb  HEIGHT 5\' 7"  5\' 7"  5\' 7"   BMI 31.32 kg/m2 30.71 kg/m2 28.97 kg/m2    Current exercise per week over 150 mins

## 2015-08-17 NOTE — Assessment & Plan Note (Signed)
Corrected pt applauded on this, she will continue low carb diet and exercise. Will reduce caloric intake to attain weight loss

## 2015-08-17 NOTE — Patient Instructions (Addendum)
CPE  in 6  Months, call if you need me before  CONGRATS on excellent labs, normal blood sugar  No BLOOD in urine on exam, no sign of infection  You have slight inflammation of tendon of rigth ankle. Use alleve one twice daily for next 5 days, and do not put excessive stress on the ankle to allow it to recover  Fasting lipid, chem 7, CBC, TSH  In 5 months and 3 weeks  Thank you  for choosing [facility]. We consider it a privelige to serve you.  Delivering excellent health care in a caring and  compassionate way is our goal.  Partnering with you,  so that together we can achieve this goal is our strategy.

## 2015-11-10 ENCOUNTER — Telehealth: Payer: Self-pay

## 2015-11-13 NOTE — Telephone Encounter (Signed)
If she calls back and is concerned about the mole , pls refer her to dermatology for evaluation of the mole, if other concern lijke pain voiced let me know

## 2015-11-13 NOTE — Telephone Encounter (Signed)
See documentation.

## 2015-11-13 NOTE — Telephone Encounter (Signed)
Noted  

## 2016-02-08 ENCOUNTER — Encounter: Payer: BLUE CROSS/BLUE SHIELD | Admitting: Family Medicine

## 2016-02-14 ENCOUNTER — Telehealth: Payer: Self-pay | Admitting: Family Medicine

## 2016-02-14 DIAGNOSIS — E785 Hyperlipidemia, unspecified: Secondary | ICD-10-CM

## 2016-02-14 DIAGNOSIS — R7303 Prediabetes: Secondary | ICD-10-CM

## 2016-02-14 NOTE — Telephone Encounter (Signed)
PLEASE UPDATE LAB ORDERS FOR [place] SHE WILL BE BY TO PICK UP, [place]

## 2016-02-15 NOTE — Telephone Encounter (Signed)
Labs updated

## 2016-02-28 LAB — CBC
HCT: 39 % (ref 35.0–45.0)
HEMOGLOBIN: 12.2 g/dL (ref 11.7–15.5)
MCH: 26.7 pg — AB (ref 27.0–33.0)
MCHC: 31.3 g/dL — ABNORMAL LOW (ref 32.0–36.0)
MCV: 85.3 fL (ref 80.0–100.0)
MPV: 9.9 fL (ref 7.5–12.5)
Platelets: 259 10*3/uL (ref 140–400)
RBC: 4.57 MIL/uL (ref 3.80–5.10)
RDW: 14.4 % (ref 11.0–15.0)
WBC: 3.2 10*3/uL — ABNORMAL LOW (ref 3.8–10.8)

## 2016-02-29 ENCOUNTER — Ambulatory Visit (INDEPENDENT_AMBULATORY_CARE_PROVIDER_SITE_OTHER): Payer: PRIVATE HEALTH INSURANCE | Admitting: Family Medicine

## 2016-02-29 ENCOUNTER — Encounter: Payer: Self-pay | Admitting: Family Medicine

## 2016-02-29 VITALS — BP 124/88 | HR 63 | Ht 67.0 in | Wt 199.0 lb

## 2016-02-29 DIAGNOSIS — Z1211 Encounter for screening for malignant neoplasm of colon: Secondary | ICD-10-CM

## 2016-02-29 DIAGNOSIS — Z1239 Encounter for other screening for malignant neoplasm of breast: Secondary | ICD-10-CM

## 2016-02-29 DIAGNOSIS — Z0000 Encounter for general adult medical examination without abnormal findings: Secondary | ICD-10-CM | POA: Diagnosis not present

## 2016-02-29 DIAGNOSIS — Z1231 Encounter for screening mammogram for malignant neoplasm of breast: Secondary | ICD-10-CM | POA: Diagnosis not present

## 2016-02-29 LAB — BASIC METABOLIC PANEL
BUN: 13 mg/dL (ref 7–25)
CALCIUM: 8.9 mg/dL (ref 8.6–10.2)
CHLORIDE: 103 mmol/L (ref 98–110)
CO2: 28 mmol/L (ref 20–31)
CREATININE: 0.83 mg/dL (ref 0.50–1.10)
GLUCOSE: 83 mg/dL (ref 65–99)
Potassium: 4.3 mmol/L (ref 3.5–5.3)
Sodium: 138 mmol/L (ref 135–146)

## 2016-02-29 LAB — LIPID PANEL
CHOLESTEROL: 175 mg/dL (ref 125–200)
HDL: 59 mg/dL (ref >=46)
LDL Cholesterol: 104 mg/dL (ref <130)
TRIGLYCERIDES: 60 mg/dL (ref <150)
Total CHOL/HDL Ratio: 3 Ratio (ref <=5.0)
VLDL: 12 mg/dL (ref <30)

## 2016-02-29 LAB — POC HEMOCCULT BLD/STL (OFFICE/1-CARD/DIAGNOSTIC): Fecal Occult Blood, POC: NEGATIVE

## 2016-02-29 LAB — TSH: TSH: 1.41 mIU/L

## 2016-02-29 NOTE — Progress Notes (Signed)
    Kendra Wong     MRN: [id number]      DOB: [birth year]  HPI: Patient is in for annual physical exam. No other health concerns are expressed or addressed at the visit. Recent labs, if available are reviewed. Immunization is reviewed , refuses flu vaccine   PE: Pleasant  female, alert and oriented x 3, in no cardio-pulmonary distress. Afebrile. HEENT No facial trauma or asymetry. Sinuses non tender.  Extra occullar muscles intact, pupils equally reactive to light. External ears normal, tympanic membranes clear. Oropharynx moist, no exudate. Neck: supple, no adenopathy,JVD or thyromegaly.No bruits.  Chest: Clear to ascultation bilaterally.No crackles or wheezes. Non tender to palpation  Breast: No asymetry,no masses or lumps. No tenderness. No nipple discharge or inversion. No axillary or supraclavicular adenopathy  Cardiovascular system; Heart sounds normal,  S1 and  S2 ,no S3.  No murmur, or thrill. Apical beat not displaced Peripheral pulses normal.  Abdomen: Soft, non tender, no organomegaly or masses. No bruits. Bowel sounds normal. No guarding, tenderness or rebound.  Rectal:  Normal sphincter tone. No rectal mass. Guaiac negative stool.  GU: External genitalia normal female genitalia , normal female distribution of hair. No lesions. Urethral meatus normal in size, no  Prolapse, no lesions visibly  Present. Bladder non tender. Vagina pink and moist , with no visible lesions , discharge present . Adequate pelvic support no  cystocele or rectocele noted Cervix pink and appears healthy, no lesions or ulcerations noted, no discharge noted from os Uterus normal size, no adnexal masses, no cervical motion or adnexal tenderness.   Musculoskeletal exam: Full ROM of spine, hips , shoulders and knees. No deformity ,swelling or crepitus noted. No muscle wasting or atrophy.   Neurologic: Cranial nerves 2 to 12 intact. Power, tone ,sensation and reflexes normal  throughout. No disturbance in gait. No tremor.  Skin: Intact, no ulceration, erythema , scaling or rash noted. Pigmentation normal throughout  Psych; Normal mood and affect. Judgement and concentration normal   Assessment & Plan:  No problem-specific Assessment & Plan notes found for this encounter.

## 2016-02-29 NOTE — Patient Instructions (Signed)
Annual physical exam in 12 months, call if yu need me befopre   Please work on good  health habits so that your health will improve. 1. Commitment to daily physical activity for 30 to 60  minutes, if you are able to do this.  2. Commitment to wise food choices. Aim for half of your  food intake to be vegetable and fruit, one quarter starchy foods, and one quarter protein. Try to eat on a regular schedule  3 meals per day, snacking between meals should be limited to vegetables or fruits or small portions of nuts. 64 ounces of water per day is generally recommended, unless you have specific health conditions, like heart failure or kidney failure where you will need to limit fluid intake.  3. Commitment to sufficient and a  good quality of physical and mental rest daily, generally between 6 to 8 hours per day.  WITH PERSISTANCE AND PERSEVERANCE, THE IMPOSSIBLE , BECOMES THE NORM!   Call in 10 months for labs to be ordered  Thank you  for choosing [facility]. We consider it a privelige to serve you.  Delivering excellent health care in a caring and  compassionate way is our goal.  Partnering with you,  so that together we can achieve this goal is our strategy.

## 2016-02-29 NOTE — Assessment & Plan Note (Signed)

## 2016-03-25 ENCOUNTER — Ambulatory Visit (INDEPENDENT_AMBULATORY_CARE_PROVIDER_SITE_OTHER): Payer: PRIVATE HEALTH INSURANCE | Admitting: Otolaryngology

## 2016-03-25 DIAGNOSIS — H6123 Impacted cerumen, bilateral: Secondary | ICD-10-CM | POA: Diagnosis not present

## 2016-05-28 ENCOUNTER — Ambulatory Visit (INDEPENDENT_AMBULATORY_CARE_PROVIDER_SITE_OTHER): Payer: PRIVATE HEALTH INSURANCE

## 2016-05-28 DIAGNOSIS — R350 Frequency of micturition: Secondary | ICD-10-CM | POA: Diagnosis not present

## 2016-05-28 DIAGNOSIS — N3000 Acute cystitis without hematuria: Secondary | ICD-10-CM

## 2016-05-28 LAB — POCT URINALYSIS DIPSTICK
Bilirubin, UA: NEGATIVE
Blood, UA: NEGATIVE
Glucose, UA: NEGATIVE
LEUKOCYTES UA: NEGATIVE
Nitrite, UA: NEGATIVE
PH UA: 6
PROTEIN UA: NEGATIVE
Spec Grav, UA: 1.025
UROBILINOGEN UA: 0.2

## 2016-05-28 NOTE — Progress Notes (Signed)
Patient c/o pressure when urinating x 2 days.  In for nurse urine check.  Urinalysis negative for UTI.    Called patient x 2 to notify.  Unable to leave voicemail due to box being full.

## 2016-09-02 ENCOUNTER — Ambulatory Visit (INDEPENDENT_AMBULATORY_CARE_PROVIDER_SITE_OTHER): Payer: PRIVATE HEALTH INSURANCE | Admitting: Otolaryngology

## 2016-09-02 DIAGNOSIS — H6123 Impacted cerumen, bilateral: Secondary | ICD-10-CM

## 2016-12-02 ENCOUNTER — Telehealth: Payer: Self-pay | Admitting: *Deleted

## 2016-12-02 DIAGNOSIS — Z1231 Encounter for screening mammogram for malignant neoplasm of breast: Secondary | ICD-10-CM

## 2016-12-02 NOTE — Telephone Encounter (Signed)
Patient called stating she needs to schedule her mammogram, patient [facility] in [place]. Please advise

## 2016-12-03 DIAGNOSIS — Z1231 Encounter for screening mammogram for malignant neoplasm of breast: Secondary | ICD-10-CM | POA: Insufficient documentation

## 2016-12-03 NOTE — Telephone Encounter (Signed)
Order entered and patient given number to call

## 2017-01-02 ENCOUNTER — Ambulatory Visit: Payer: PRIVATE HEALTH INSURANCE

## 2017-01-06 ENCOUNTER — Ambulatory Visit
Admission: RE | Admit: 2017-01-06 | Discharge: 2017-01-06 | Disposition: A | Discharge status: Home / Self Care | Payer: PRIVATE HEALTH INSURANCE | Source: Ambulatory Visit | Attending: Family Medicine | Admitting: Family Medicine

## 2017-01-06 DIAGNOSIS — Z1231 Encounter for screening mammogram for malignant neoplasm of breast: Secondary | ICD-10-CM

## 2017-02-21 ENCOUNTER — Other Ambulatory Visit (HOSPITAL_COMMUNITY)
Admission: RE | Admit: 2017-02-21 | Discharge: 2017-02-21 | Disposition: A | Discharge status: Home / Self Care | Payer: PRIVATE HEALTH INSURANCE | Source: Ambulatory Visit | Attending: Obstetrics and Gynecology | Admitting: Obstetrics and Gynecology

## 2017-02-21 ENCOUNTER — Other Ambulatory Visit: Payer: Self-pay | Admitting: Obstetrics and Gynecology

## 2017-02-21 DIAGNOSIS — Z124 Encounter for screening for malignant neoplasm of cervix: Secondary | ICD-10-CM | POA: Insufficient documentation

## 2017-02-26 LAB — CYTOLOGY - PAP
DIAGNOSIS: NEGATIVE
HPV (WINDOPATH): NOT DETECTED

## 2017-03-06 ENCOUNTER — Other Ambulatory Visit: Payer: Self-pay

## 2017-03-06 ENCOUNTER — Telehealth: Payer: Self-pay | Admitting: *Deleted

## 2017-03-06 ENCOUNTER — Encounter: Payer: Self-pay | Admitting: Family Medicine

## 2017-03-06 ENCOUNTER — Ambulatory Visit (INDEPENDENT_AMBULATORY_CARE_PROVIDER_SITE_OTHER): Payer: PRIVATE HEALTH INSURANCE | Admitting: Family Medicine

## 2017-03-06 VITALS — BP 120/74 | HR 68 | Temp 98.0°F | Resp 16 | Ht 67.0 in | Wt 207.5 lb

## 2017-03-06 DIAGNOSIS — J029 Acute pharyngitis, unspecified: Secondary | ICD-10-CM

## 2017-03-06 NOTE — Progress Notes (Signed)
Patient ID: Kendra Wong, female    DOB: 1976/02/29, 41 y.o.   MRN: [id number]  Chief Complaint  Patient presents with  . Sore Throat  . Cough    Allergies Sulfa antibiotics  Subjective:   Kendra Wong is a 41 y.o. female who presents to [facility] today.  HPI Kendra Wong presents with a 1 day history of sore throat.  She reports she comes in because she was worried that she could have strep throat and did not want to infect anyone at her office.  She has not had any strep throat exposures or history of strep throat in the past several years.  She reports that she has had a mild cough which started today, but it is sporadic.  She denies any sputum production.  She denies any shortness of breath, difficulty swallowing, or pain with swallowing.  She reports her throat has been a little bit scratchy and her voice has been hoarse today.  She has not felt the need to take any medication for this.  She denies any sinus pain, ear pain, abdominal pain, or dysuria.      Past Medical History:  Diagnosis Date  . Obesity     Past Surgical History:  Procedure Laterality Date  . TONSILLECTOMY    . TYMPANOSTOMY TUBE PLACEMENT      Family History  Problem Relation Age of Onset  . Diabetes Father      Social History   Socioeconomic History  . Marital status: Married    Spouse name: Not on file  . Number of children: 2  . Years of education: Not on file  . Highest education level: Not on file  Social Needs  . Financial resource strain: Not on file  . Food insecurity - worry: Not on file  . Food insecurity - inability: Not on file  . Transportation needs - medical: Not on file  . Transportation needs - non-medical: Not on file  Occupational History  . Occupation: unemployed   Tobacco Use  . Smoking status: Never Smoker  Substance and Sexual Activity  . Alcohol use: No  . Drug use: No  . Sexual activity: Not on file  Other Topics Concern  . Not on file    Social History Narrative  . Not on file    Review of Systems  HENT: Positive for postnasal drip, sore throat and voice change. Negative for congestion, ear discharge, ear pain, facial swelling, mouth sores, rhinorrhea, tinnitus and trouble swallowing.        Voice has been a little scratchy and hoarse today.  Eyes: Negative for visual disturbance.  Respiratory: Positive for cough.   Cardiovascular: Negative for chest pain.  Gastrointestinal: Negative for abdominal pain.  Genitourinary: Negative for dysuria.  Musculoskeletal: Positive for arthralgias.  Neurological: Negative for tremors, speech difficulty, light-headedness and numbness.  Hematological: Negative for adenopathy.  Psychiatric/Behavioral: Negative for dysphoric mood.     Objective:   BP 120/74 (BP Location: Left Arm, Patient Position: Sitting, Cuff Size: Normal)   Pulse 68   Temp 98 F (36.7 C) (Other (Comment))   Resp 16   Ht 5\' 7"  (1.702 m)   Wt 207 lb 8 oz (94.1 kg)   SpO2 98%   BMI 32.50 kg/m   Physical Exam  Constitutional: She is oriented to person, place, and time. She appears well-developed and well-nourished.  Non-toxic appearance. She does not appear ill.  Patient does have posterior pharyngeal cobblestoning and evidence  of drainage down the back of her throat.  Pharynx is slightly erythematous but no exudate on pharynx or tonsils bilaterally.  Tonsils not enlarged.  HENT:  Head: Normocephalic.  Right Ear: Tympanic membrane and ear canal normal.  Left Ear: Tympanic membrane and ear canal normal.  Mouth/Throat: Uvula is midline, oropharynx is clear and moist and mucous membranes are normal. No oral lesions. No uvula swelling. No oropharyngeal exudate, posterior oropharyngeal edema or tonsillar abscesses.  Patient does have wax in her canals bilaterally, tympanic membrane on the left is somewhat difficult to visualize due to cerumen obstruction.  However the left tympanic membrane is normal.  Neck:  Normal range of motion. Neck supple. No thyromegaly present.  Mild submandibular glands tender to palpation but no enlargement.  Cardiovascular: Normal rate, regular rhythm, normal heart sounds and intact distal pulses.  Pulmonary/Chest: Effort normal.  Lymphadenopathy:    She has no cervical adenopathy.  Neurological: She is alert and oriented to person, place, and time.  Skin: Skin is warm and dry.  Psychiatric: She has a normal mood and affect. Her behavior is normal.     Assessment and Plan   1. Viral pharyngitis Discussed viral etiology with patient today.  Supportive care recommended including Tylenol or Advil over-the-counter, salt water gargles, Chloraseptic throat spray, and increased fluid intake and rest.  We discussed that these interventions would possibly help her feel better but that the viral syndrome would just take approximately 5-7 days to resolve.  Patient was counseled that if her symptoms worsen or she develops any worrisome symptoms to please call or return to the clinic.    She was counseled that if her symptoms did not resolve to please follow-up.  She was encouraged to keep her regularly scheduled visits with ENT to have her ears cleaned and irrigated.  Her questions were answered and she was told to follow-up with her primary care physician for management of her medical problems.  Patient did inquire as to why I did not believe she had strep throat.  We discussed the C criteria for diagnosis of strep throat.  Patient does not have any pharyngeal exudate or tonsillar exudate, no fever, she does have tender glands, but she does have a cough.  We discussed that she most likely has a viral pharyngitis.  I did tell her I could check her for strep pharyngitis with a test if she preferred, but she deferred. No Follow-up on file. Caren Macadam, MD 03/06/2017

## 2017-03-06 NOTE — Patient Instructions (Signed)

## 2017-03-06 NOTE — Telephone Encounter (Signed)
Patient called left message stating she has been having a sore throat for about 4 days now and it is now feeling "raw". Patient stated she is also having back pain. Please advise [phone]

## 2017-03-06 NOTE — Telephone Encounter (Signed)
I called patient and scheduled her to see Dr Mannie Stabile for her sore throat today at 1:20. I made patient aware that Dr Mannie Stabile can treat her for her sore throat but for her back pain Dr Moshe Cipro will have to treat. Patient wants to talk with the nurse regarding her back pain. Please advise

## 2017-03-07 NOTE — Telephone Encounter (Signed)
Patient states its better and will call back if needs anything

## 2017-03-17 ENCOUNTER — Ambulatory Visit (INDEPENDENT_AMBULATORY_CARE_PROVIDER_SITE_OTHER): Payer: PRIVATE HEALTH INSURANCE | Admitting: Otolaryngology

## 2017-03-17 DIAGNOSIS — H6123 Impacted cerumen, bilateral: Secondary | ICD-10-CM

## 2017-04-25 ENCOUNTER — Telehealth: Payer: Self-pay

## 2017-04-25 NOTE — Telephone Encounter (Signed)
Patient walked in office and said she took a hard fall yesterday evening off a 42ft drop and today she was hurting all over and very sore- requesting xrays. Advised to go to the ER for full evaluation. Patient understands

## 2017-04-30 ENCOUNTER — Telehealth: Payer: Self-pay | Admitting: Family Medicine

## 2017-04-30 NOTE — Telephone Encounter (Signed)
I recommend she use OTC decongestant ;like robitussin DM per recommendations on the bottle, and ensure adequate  Fluid intake, that is an effective decongestant. Needs OV if in 7 5 to 10 days or less worsening with fever , aches , chills etc

## 2017-04-30 NOTE — Telephone Encounter (Signed)
Sick on Dec 30th, and now is coughing up blood with the mucus, twice yesterday, and again this morning. Can you call her in something, concerned about the blood.

## 2017-04-30 NOTE — Telephone Encounter (Signed)
Offered appt- did not want to come in. Coughing since Dec 30, sinus congestion, some blood tinged mucus, no fever.Wants something sent to [facility]. Please advise

## 2017-05-01 NOTE — Telephone Encounter (Signed)
Can you please relay the following message.  I recommend she use OTC decongestant ;like robitussin DM per recommendations on the bottle, and ensure adequate  Fluid intake, that is an effective decongestant. Needs OV if in 7 5 to 10 days or less worsening with fever , aches , chills etc

## 2017-05-01 NOTE — Telephone Encounter (Signed)
Patient left a voicemail with the same information as previous message. Requesting medication for possible infection. Cb#: [phone]

## 2017-05-02 ENCOUNTER — Encounter: Payer: Self-pay | Admitting: Family Medicine

## 2017-05-02 ENCOUNTER — Telehealth: Payer: Self-pay | Admitting: Family Medicine

## 2017-05-02 ENCOUNTER — Other Ambulatory Visit: Payer: Self-pay

## 2017-05-02 ENCOUNTER — Ambulatory Visit (INDEPENDENT_AMBULATORY_CARE_PROVIDER_SITE_OTHER): Payer: PRIVATE HEALTH INSURANCE | Admitting: Family Medicine

## 2017-05-02 NOTE — Telephone Encounter (Signed)
Called patient to relay message from [place] and Dr.Simpson, no answer, full voicemail unable to leave message.

## 2017-05-02 NOTE — Progress Notes (Signed)
    Patient ID: [place] N Va[place], female    DOB: [birth year], 42 y.o.   MRN: [id number]  Chief Complaint  Patient presents with  . Cough    Allergies Sulfa antibiotics  Subjective:   [place] TORA PRUNTY is a 42 y.o. female who presents to [facility] today.  HPI HPI  Past Medical History:  Diagnosis Date  . Obesity     Past Surgical History:  Procedure Laterality Date  . TONSILLECTOMY    . TYMPANOSTOMY TUBE PLACEMENT      Family History  Problem Relation Age of Onset  . Diabetes Father      Social History   Socioeconomic History  . Marital status: Married    Spouse name: Not on file  . Number of children: 2  . Years of education: Not on file  . Highest education level: Not on file  Social Needs  . Financial resource strain: Not on file  . Food insecurity - worry: Not on file  . Food insecurity - inability: Not on file  . Transportation needs - medical: Not on file  . Transportation needs - non-medical: Not on file  Occupational History  . Occupation: unemployed   Tobacco Use  . Smoking status: Never Smoker  . Smokeless tobacco: Never Used  Substance and Sexual Activity  . Alcohol use: No  . Drug use: No  . Sexual activity: Not on file  Other Topics Concern  . Not on file  Social History Narrative   Non smoker    Review of Systems   Objective:   There were no vitals taken for this visit.  Physical Exam   Assessment and Plan   There are no diagnoses linked to this encounter.   No Follow-up on file. Nance Mccombs, LPN 3/0/0923

## 2017-07-24 ENCOUNTER — Ambulatory Visit: Payer: PRIVATE HEALTH INSURANCE | Admitting: Family Medicine

## 2017-09-29 ENCOUNTER — Ambulatory Visit (INDEPENDENT_AMBULATORY_CARE_PROVIDER_SITE_OTHER): Payer: PRIVATE HEALTH INSURANCE | Admitting: Otolaryngology

## 2017-10-06 ENCOUNTER — Ambulatory Visit (INDEPENDENT_AMBULATORY_CARE_PROVIDER_SITE_OTHER): Payer: PRIVATE HEALTH INSURANCE | Admitting: Otolaryngology

## 2017-10-06 DIAGNOSIS — H6123 Impacted cerumen, bilateral: Secondary | ICD-10-CM

## 2017-10-09 ENCOUNTER — Ambulatory Visit: Payer: PRIVATE HEALTH INSURANCE | Admitting: Family Medicine

## 2017-10-23 ENCOUNTER — Encounter: Payer: Self-pay | Admitting: Family Medicine

## 2017-10-23 ENCOUNTER — Ambulatory Visit (INDEPENDENT_AMBULATORY_CARE_PROVIDER_SITE_OTHER): Payer: PRIVATE HEALTH INSURANCE | Admitting: Family Medicine

## 2017-10-23 VITALS — BP 148/92 | HR 81 | Resp 16 | Ht 67.0 in | Wt 213.0 lb

## 2017-10-23 DIAGNOSIS — Z23 Encounter for immunization: Secondary | ICD-10-CM

## 2017-10-23 DIAGNOSIS — M25552 Pain in left hip: Secondary | ICD-10-CM | POA: Diagnosis not present

## 2017-10-23 DIAGNOSIS — E669 Obesity, unspecified: Secondary | ICD-10-CM | POA: Diagnosis not present

## 2017-10-23 DIAGNOSIS — R030 Elevated blood-pressure reading, without diagnosis of hypertension: Secondary | ICD-10-CM | POA: Diagnosis not present

## 2017-10-23 DIAGNOSIS — G8929 Other chronic pain: Secondary | ICD-10-CM

## 2017-10-23 DIAGNOSIS — M5442 Lumbago with sciatica, left side: Secondary | ICD-10-CM

## 2017-10-23 DIAGNOSIS — M549 Dorsalgia, unspecified: Secondary | ICD-10-CM | POA: Insufficient documentation

## 2017-10-23 DIAGNOSIS — M545 Low back pain, unspecified: Secondary | ICD-10-CM | POA: Insufficient documentation

## 2017-10-23 NOTE — Progress Notes (Signed)
   [place] N Barnaby     MRN: [id number]      DOB: 03-12-1976   HPI Ms. Zwicker is here with a 2 month h/o low back pain and left hip pain x 2 months, worse in the moprning First noticed the hip pain when in a race, occurs mainly at night with direct pressure on the hip, wit h a pillo  ROS Denies recent fever or chills. Denies sinus pressure, nasal congestion, ear pain or sore throat. Denies chest congestion, productive cough or wheezing. Denies chest pains, palpitations and leg swelling Denies abdominal pain, nausea, vomiting,diarrhea or constipation.   Denies dysuria, frequency, hesitancy or incontinence. Denies joint pain, swelling and limitation in mobility. Denies headaches, seizures, numbness, or tingling. Denies depression, anxiety or insomnia. Denies skin break down or rash.   PE  BP (!) 148/92   Pulse 81   Resp 16   Ht 5\' 7"  (1.702 m)   Wt 213 lb (96.6 kg)   LMP 10/15/2017   SpO2 98%   BMI 33.36 kg/m  Patient alert and oriented and in no cardiopulmonary distress.  HEENT: No facial asymmetry, EOMI,   oropharynx pink and moist.  Neck supple no JVD, no mass.  Chest: Clear to auscultation bilaterally.  CVS: S1, S2 no murmurs, no S3.Regular rate.  ABD: Soft non tender.   Ext: No edema  MS: Adequate ROM spine, shoulders, and knees.Decreased in left hip due to pain  Skin: Intact, no ulcerations or rash noted.  Psych: Good eye contact, normal affect. Memory intact not anxious or depressed appearing.  CNS: CN 2-12 intact, power,  normal throughout.no focal deficits noted.   Assessment & Plan  Back pain  2 month history of low back pain primarily whenfirst arising from a supine position, needs X ray to further evaluate  Chronic left hip pain 2 month h/o hefy hip pain with limitation of mobility started during a walk several years ago, likely early arthritis, needs imaging  Obesity (BMI 30.0-34.9) Deteriorated. Patient re-educated about  the importance of  commitment to a  minimum of 150 minutes of exercise per week.  The importance of healthy food choices with portion control discussed. Encouraged to start a food diary, count calories and to consider  joining a support group. Sample diet sheets offered. Goals set by the patient for the next several months.   Weight /BMI 10/23/2017 05/02/2017 03/06/2017  WEIGHT 213 lb 207 lb 12 oz 207 lb 8 oz  HEIGHT 5\' 7"  5\' 7"  5\' 7"   BMI 33.36 kg/m2 32.54 kg/m2 32.5 kg/m2      Elevated blood pressure reading in office without diagnosis of hypertension Blood pressure elevated at this visit, however , since last seen Ms birkhead has gained a significant amt of weight and also is not exercising as in the past, ion part , because of joint pain, she will work on both and return in the next 8 weeks for re evaluation DASH diet and commitment to daily physical activity for a minimum of 30 minutes discussed and encouraged, as a part of hypertension management. The importance of attaining a healthy weight is also discussed.  BP/Weight 10/23/2017 05/02/2017 03/06/2017 02/29/2016 08/17/2015 02/16/2015 2/59/5638  Systolic BP 756 433 295 188 416 606 301  Diastolic BP 92 80 74 88 84 74 84  Wt. (Lbs) 213 207.75 207.5 199 200 196.12 185  BMI 33.36 32.54 32.5 31.17 31.32 30.71 28.97

## 2017-10-23 NOTE — Assessment & Plan Note (Addendum)
2 month history of low back pain primarily whenfirst arising from a supine position, needs X ray to further evaluate

## 2017-10-23 NOTE — Patient Instructions (Addendum)
F/U in 10 to 12 weeks, call if you need me sooner  Please get X rays of h ip and back as soon as possible , I will send result via my chart message  Blood pressure is elevated  Weight loss will lower this, also a diet rich in vegetable and , fruit and low in salt, please also reduce sugar and flour  It is important that you exercise regularly at least 30 minutes 5 times a week. If you develop chest pain, have severe difficulty breathing, or feel very tired, stop exercising immediately and seek medical attention     Weight loss of 10 pounds  I think pain is from early arthirtitis

## 2017-10-24 ENCOUNTER — Ambulatory Visit (HOSPITAL_COMMUNITY)
Admission: RE | Admit: 2017-10-24 | Discharge: 2017-10-24 | Disposition: A | Discharge status: Home / Self Care | Payer: PRIVATE HEALTH INSURANCE | Source: Ambulatory Visit | Attending: Family Medicine | Admitting: Family Medicine

## 2017-10-24 ENCOUNTER — Other Ambulatory Visit: Payer: Self-pay | Admitting: Family Medicine

## 2017-10-24 DIAGNOSIS — G8929 Other chronic pain: Secondary | ICD-10-CM | POA: Diagnosis not present

## 2017-10-24 DIAGNOSIS — M25552 Pain in left hip: Principal | ICD-10-CM

## 2017-10-24 DIAGNOSIS — M5442 Lumbago with sciatica, left side: Secondary | ICD-10-CM | POA: Insufficient documentation

## 2017-10-27 ENCOUNTER — Encounter: Payer: Self-pay | Admitting: Family Medicine

## 2017-10-27 DIAGNOSIS — Z23 Encounter for immunization: Secondary | ICD-10-CM | POA: Insufficient documentation

## 2017-10-27 DIAGNOSIS — G8929 Other chronic pain: Secondary | ICD-10-CM | POA: Insufficient documentation

## 2017-10-27 DIAGNOSIS — M25552 Pain in left hip: Secondary | ICD-10-CM

## 2017-10-27 DIAGNOSIS — R030 Elevated blood-pressure reading, without diagnosis of hypertension: Secondary | ICD-10-CM | POA: Insufficient documentation

## 2017-10-27 NOTE — Assessment & Plan Note (Signed)
2 month h/o hefy hip pain with limitation of mobility started during a walk several years ago, likely early arthritis, needs imaging

## 2017-10-27 NOTE — Assessment & Plan Note (Signed)
Blood pressure elevated at this visit, however , since last seen Ms fromme has gained a significant amt of weight and also is not exercising as in the past, ion part , because of joint pain, she will work on both and return in the next 8 weeks for re evaluation DASH diet and commitment to daily physical activity for a minimum of 30 minutes discussed and encouraged, as a part of hypertension management. The importance of attaining a healthy weight is also discussed.  BP/Weight 10/23/2017 05/02/2017 03/06/2017 02/29/2016 08/17/2015 02/16/2015 9/39/6886  Systolic BP 484 720 721 828 833 744 514  Diastolic BP 92 80 74 88 84 74 84  Wt. (Lbs) 213 207.75 207.5 199 200 196.12 185  BMI 33.36 32.54 32.5 31.17 31.32 30.71 28.97

## 2017-10-27 NOTE — Assessment & Plan Note (Signed)
After obtaining informed consent, the vaccine is  administered by LPN.  

## 2017-10-27 NOTE — Assessment & Plan Note (Signed)
Deteriorated. Patient re-educated about  the importance of commitment to a  minimum of 150 minutes of exercise per week.  The importance of healthy food choices with portion control discussed. Encouraged to start a food diary, count calories and to consider  joining a support group. Sample diet sheets offered. Goals set by the patient for the next several months.   Weight /BMI 10/23/2017 05/02/2017 03/06/2017  WEIGHT 213 lb 207 lb 12 oz 207 lb 8 oz  HEIGHT 5\' 7"  5\' 7"  5\' 7"   BMI 33.36 kg/m2 32.54 kg/m2 32.5 kg/m2

## 2018-01-08 ENCOUNTER — Encounter: Payer: Self-pay | Admitting: Family Medicine

## 2018-01-08 ENCOUNTER — Ambulatory Visit (INDEPENDENT_AMBULATORY_CARE_PROVIDER_SITE_OTHER): Payer: PRIVATE HEALTH INSURANCE | Admitting: Family Medicine

## 2018-01-08 VITALS — BP 140/90 | HR 77 | Resp 16 | Ht 67.0 in | Wt 210.0 lb

## 2018-01-08 DIAGNOSIS — E669 Obesity, unspecified: Secondary | ICD-10-CM

## 2018-01-08 DIAGNOSIS — R030 Elevated blood-pressure reading, without diagnosis of hypertension: Secondary | ICD-10-CM | POA: Diagnosis not present

## 2018-01-08 DIAGNOSIS — M545 Low back pain: Secondary | ICD-10-CM

## 2018-01-08 DIAGNOSIS — G8929 Other chronic pain: Secondary | ICD-10-CM | POA: Diagnosis not present

## 2018-01-08 NOTE — Patient Instructions (Addendum)
F/U in mid January, call if you need me  Before  Need to change food choice for improved health  Blood pressure an issue   CONGRATS on weight loss, PLEASE  do this each month, need a NORMAL blood pressure, which means NABS and chips , salty snacks in packs are not healthy neither is sugar

## 2018-01-10 ENCOUNTER — Encounter: Payer: Self-pay | Admitting: Family Medicine

## 2018-01-10 NOTE — Progress Notes (Signed)
   [place] N Rayon     MRN: [id number]      DOB: Mar 11, 1976   HPI [name] is here for follow up and re-evaluation of chronic medical conditions, most specifically elevated blood pressure andher back pain, weight management are the  major  Problems.Preventive health is updated, specifically  Cancer screening and Immunization, she will get her flu vaccine at work   States her back is a bit less painful, she still has to be careful about her movements, and believes that weight loss will resolve the situation Has started exercising more and is eating mindfully has lost 3 pounds, still does not want to start medication for her blood pressure which is unchanged ROS Denies recent fever or chills. Denies sinus pressure, nasal congestion, ear pain or sore throat. Denies chest congestion, productive cough or wheezing. Denies chest pains, palpitations and leg swelling Denies abdominal pain, nausea, vomiting,diarrhea or constipation.   Denies dysuria, frequency, hesitancy or incontinence.  Denies headaches, seizures, numbness, or tingling. Denies depression, anxiety or insomnia. Denies skin break down or rash.   PE  BP 140/90   Pulse 77   Resp 16   Ht 5\' 7"  (1.702 m)   Wt 210 lb (95.3 kg)   SpO2 97%   BMI 32.89 kg/m   Patient alert and oriented and in no cardiopulmonary distress.  HEENT: No facial asymmetry, EOMI,   oropharynx pink and moist.  Neck supple no JVD, no mass.  Chest: Clear to auscultation bilaterally.  CVS: S1, S2 no murmurs, no S3.Regular rate.  ABD: Soft non tender.   Ext: No edema  MS: Adequate ROM spine, shoulders, hips and knees.  Skin: Intact, no ulcerations or rash noted.  Psych: Good eye contact, normal affect. Memory intact not anxious or depressed appearing.  CNS: CN 2-12 intact, power,  normal throughout.no focal deficits noted.   Assessment & Plan  Elevated blood pressure reading in office without diagnosis of hypertension Unchanged, will have an  additional 4 months for weight loss and lifestyle modification to correct the problem, if no change needs medication and understands this DASH diet and commitment to daily physical activity for a minimum of 30 minutes discussed and encouraged, as a part of hypertension management. The importance of attaining a healthy weight is also discussed.  BP/Weight 01/08/2018 10/23/2017 05/02/2017 03/06/2017 02/29/2016 08/17/2015 25/36/6440  Systolic BP 347 425 956 387 564 332 951  Diastolic BP 90 92 80 74 88 84 74  Wt. (Lbs) 210 213 207.75 207.5 199 200 196.12  BMI 32.89 33.36 32.54 32.5 31.17 31.32 30.71       Back pain Reports slight improvement , has to be careful with her movements, reassured that her X ray is normal, plans to work on weight loss and exercise  Obesity (BMI 30.0-34.9) Improved she is congratulated on this and encouraged to continue same. Patient re-educated about  the importance of commitment to a  minimum of 150 minutes of exercise per week.  The importance of healthy food choices with portion control discussed. Encouraged to start a food diary, count calories and to consider  joining a support group. Sample diet sheets offered. Goals set by the patient for the next several months.   Weight /BMI 01/08/2018 10/23/2017 05/02/2017  WEIGHT 210 lb 213 lb 207 lb 12 oz  HEIGHT 5\' 7"  5\' 7"  5\' 7"   BMI 32.89 kg/m2 33.36 kg/m2 32.54 kg/m2

## 2018-01-10 NOTE — Assessment & Plan Note (Signed)
Unchanged, will have an additional 4 months for weight loss and lifestyle modification to correct the problem, if no change needs medication and understands this DASH diet and commitment to daily physical activity for a minimum of 30 minutes discussed and encouraged, as a part of hypertension management. The importance of attaining a healthy weight is also discussed.  BP/Weight 01/08/2018 10/23/2017 05/02/2017 03/06/2017 02/29/2016 08/17/2015 64/35/3912  Systolic BP 258 346 219 471 252 712 929  Diastolic BP 90 92 80 74 88 84 74  Wt. (Lbs) 210 213 207.75 207.5 199 200 196.12  BMI 32.89 33.36 32.54 32.5 31.17 31.32 30.71

## 2018-01-10 NOTE — Assessment & Plan Note (Signed)
Improved she is congratulated on this and encouraged to continue same. Patient re-educated about  the importance of commitment to a  minimum of 150 minutes of exercise per week.  The importance of healthy food choices with portion control discussed. Encouraged to start a food diary, count calories and to consider  joining a support group. Sample diet sheets offered. Goals set by the patient for the next several months.   Weight /BMI 01/08/2018 10/23/2017 05/02/2017  WEIGHT 210 lb 213 lb 207 lb 12 oz  HEIGHT 5\' 7"  5\' 7"  5\' 7"   BMI 32.89 kg/m2 33.36 kg/m2 32.54 kg/m2

## 2018-01-10 NOTE — Assessment & Plan Note (Signed)
Reports slight improvement , has to be careful with her movements, reassured that her X ray is normal, plans to work on weight loss and exercise

## 2018-02-26 ENCOUNTER — Telehealth: Payer: Self-pay | Admitting: Family Medicine

## 2018-02-26 NOTE — Telephone Encounter (Signed)
Pt LVM that Dr Moshe Cipro had offered BP medication prior, and she didn't want. However she now wants to try them, can some be called in

## 2018-02-27 ENCOUNTER — Other Ambulatory Visit: Payer: Self-pay | Admitting: Family Medicine

## 2018-02-27 MED ORDER — AMLODIPINE BESYLATE 2.5 MG PO TABS: 2.5000 mg | ORAL_TABLET | Freq: Every day | ORAL | 2 refills | Status: DC

## 2018-02-27 NOTE — Telephone Encounter (Signed)
Called and spoke with patient, advised her of medication called in for her and also advised her to call and make a follow up appointment for January 2020. Patient acknowledged understanding

## 2018-02-27 NOTE — Telephone Encounter (Signed)
pls let her knoiw I sent amlodipinre to her pharmacy, 3 months total, She NEEDS to schedule an appt by 2nd week in January for re eval, she has no appt on schedule

## 2018-04-20 ENCOUNTER — Ambulatory Visit (INDEPENDENT_AMBULATORY_CARE_PROVIDER_SITE_OTHER): Payer: PRIVATE HEALTH INSURANCE | Admitting: Otolaryngology

## 2018-04-20 DIAGNOSIS — H608X3 Other otitis externa, bilateral: Secondary | ICD-10-CM

## 2018-04-20 DIAGNOSIS — H6123 Impacted cerumen, bilateral: Secondary | ICD-10-CM

## 2018-06-22 ENCOUNTER — Emergency Department (HOSPITAL_COMMUNITY)
Admission: EM | Admit: 2018-06-22 | Discharge: 2018-06-22 | Disposition: A | Discharge status: Home / Self Care | Payer: PRIVATE HEALTH INSURANCE | Attending: Emergency Medicine | Admitting: Emergency Medicine

## 2018-06-22 ENCOUNTER — Emergency Department (HOSPITAL_COMMUNITY): Payer: PRIVATE HEALTH INSURANCE

## 2018-06-22 ENCOUNTER — Telehealth: Payer: Self-pay | Admitting: *Deleted

## 2018-06-22 ENCOUNTER — Encounter (HOSPITAL_COMMUNITY): Payer: Self-pay | Admitting: Emergency Medicine

## 2018-06-22 ENCOUNTER — Other Ambulatory Visit: Payer: Self-pay

## 2018-06-22 DIAGNOSIS — R531 Weakness: Secondary | ICD-10-CM | POA: Diagnosis not present

## 2018-06-22 DIAGNOSIS — Z79899 Other long term (current) drug therapy: Secondary | ICD-10-CM | POA: Diagnosis not present

## 2018-06-22 DIAGNOSIS — I10 Essential (primary) hypertension: Secondary | ICD-10-CM | POA: Diagnosis not present

## 2018-06-22 DIAGNOSIS — R200 Anesthesia of skin: Secondary | ICD-10-CM | POA: Diagnosis not present

## 2018-06-22 HISTORY — DX: Essential (primary) hypertension: I10

## 2018-06-22 LAB — CBC WITH DIFFERENTIAL/PLATELET
Abs Immature Granulocytes: 0 10*3/uL (ref 0.00–0.07)
BASOS ABS: 0 10*3/uL (ref 0.0–0.1)
BASOS PCT: 1 %
EOS ABS: 0 10*3/uL (ref 0.0–0.5)
Eosinophils Relative: 1 %
HCT: 38.7 % (ref 36.0–46.0)
Hemoglobin: 11.8 g/dL — ABNORMAL LOW (ref 12.0–15.0)
Immature Granulocytes: 0 %
LYMPHS ABS: 1.4 10*3/uL (ref 0.7–4.0)
Lymphocytes Relative: 37 %
MCH: 24.7 pg — ABNORMAL LOW (ref 26.0–34.0)
MCHC: 30.5 g/dL (ref 30.0–36.0)
MCV: 81.1 fL (ref 80.0–100.0)
Monocytes Absolute: 0.4 10*3/uL (ref 0.1–1.0)
Monocytes Relative: 12 %
NEUTROS PCT: 49 %
NRBC: 0 % (ref 0.0–0.2)
Neutro Abs: 1.9 10*3/uL (ref 1.7–7.7)
PLATELETS: 213 10*3/uL (ref 150–400)
RBC: 4.77 MIL/uL (ref 3.87–5.11)
RDW: 14.6 % (ref 11.5–15.5)
WBC: 3.7 10*3/uL — AB (ref 4.0–10.5)

## 2018-06-22 LAB — COMPREHENSIVE METABOLIC PANEL
ALBUMIN: 4 g/dL (ref 3.5–5.0)
ALT: 17 U/L (ref 0–44)
ANION GAP: 8 (ref 5–15)
AST: 19 U/L (ref 15–41)
Alkaline Phosphatase: 50 U/L (ref 38–126)
BUN: 14 mg/dL (ref 6–20)
CHLORIDE: 106 mmol/L (ref 98–111)
CO2: 24 mmol/L (ref 22–32)
Calcium: 8.8 mg/dL — ABNORMAL LOW (ref 8.9–10.3)
Creatinine, Ser: 0.82 mg/dL (ref 0.44–1.00)
GFR calc Af Amer: >60 mL/min (ref >60)
GFR calc non Af Amer: >60 mL/min (ref >60)
Glucose, Bld: 100 mg/dL — ABNORMAL HIGH (ref 70–99)
POTASSIUM: 3.8 mmol/L (ref 3.5–5.1)
SODIUM: 138 mmol/L (ref 135–145)
Total Bilirubin: 0.4 mg/dL (ref 0.3–1.2)
Total Protein: 6.9 g/dL (ref 6.5–8.1)

## 2018-06-22 LAB — URINALYSIS, ROUTINE W REFLEX MICROSCOPIC
Bilirubin Urine: NEGATIVE
Glucose, UA: NEGATIVE mg/dL
Hgb urine dipstick: NEGATIVE
KETONES UR: NEGATIVE mg/dL
Leukocytes,Ua: NEGATIVE
Nitrite: NEGATIVE
Protein, ur: NEGATIVE mg/dL
Specific Gravity, Urine: 1.006 (ref 1.005–1.030)
pH: 6 (ref 5.0–8.0)

## 2018-06-22 LAB — RAPID URINE DRUG SCREEN, HOSP PERFORMED
Amphetamines: NOT DETECTED
BENZODIAZEPINES: NOT DETECTED
Barbiturates: NOT DETECTED
Cocaine: NOT DETECTED
Opiates: NOT DETECTED
Tetrahydrocannabinol: NOT DETECTED

## 2018-06-22 LAB — I-STAT BETA HCG BLOOD, ED (MC, WL, AP ONLY): I-stat hCG, quantitative: <5 m[IU]/mL (ref <5)

## 2018-06-22 NOTE — Discharge Instructions (Addendum)
MRI here today without evidence of any brain tumor or evidence of stroke.  Did show some evidence of changes in the brain that could be secondary to your history of hypertension or could be an early demyelinating type process.  Basically something like multiple sclerosis.  Recommend following up with both your primary care doctor and now giving Dr. Merlene Laughter the neurologist on call for follow-up.  Work note provided.  Return for any new or worse symptoms.

## 2018-06-22 NOTE — Telephone Encounter (Signed)
I totally agree with the recommendation

## 2018-06-22 NOTE — ED Provider Notes (Signed)
[facility] EMERGENCY DEPARTMENT Provider Note   CSN: [id number] Arrival date & time: 06/22/18  1032    History   Chief Complaint Chief Complaint  Patient presents with  . Numbness    HPI Kendra Wong is a 43 y.o. female.     Patient went to bed last night at 9:30 PM everything was normal.  Patient awoke at 1 in the morning and had some right facial numbness.  Woke up later this morning and noticed some numbness to her right great toe.  Facial numbness was still there.  Patient denies any headache any visual changes any weakness any history of similar symptoms.  Patient past medical history significant for hypertension she takes Norvasc for that.     Past Medical History:  Diagnosis Date  . Hypertension   . Obesity     Patient Active Problem List   Diagnosis Date Noted  . Chronic left hip pain 10/27/2017  . Elevated blood pressure reading in office without diagnosis of hypertension 10/27/2017  . Back pain 10/23/2017  . Obesity (BMI 30.0-34.9) 09/12/2014  . Leukopenia 07/11/2013  . Seasonal allergies 09/07/2012  . Hyperlipidemia LDL goal <100 04/16/2010    Past Surgical History:  Procedure Laterality Date  . TONSILLECTOMY    . TYMPANOSTOMY TUBE PLACEMENT       OB History   No obstetric history on file.      Home Medications    Prior to Admission medications   Medication Sig Start Date End Date Taking? Authorizing Provider  amLODipine (NORVASC) 2.5 MG tablet Take 1 tablet (2.5 mg total) by mouth daily. 02/27/18  Yes Fayrene Helper, MD  mometasone (ELOCON) 0.1 % cream APP AA DAILY PRF ITCHING 05/08/18  Yes [provider]    Family History Family History  Problem Relation Age of Onset  . Diabetes Father     Social History Social History   Tobacco Use  . Smoking status: Never Smoker  . Smokeless tobacco: Never Used  Substance Use Topics  . Alcohol use: Yes    Comment: occasionally  . Drug use: No     Allergies   Sulfa  antibiotics   Review of Systems Review of Systems  Constitutional: Negative for chills and fever.  HENT: Negative for congestion, rhinorrhea and sore throat.   Eyes: Negative for photophobia and visual disturbance.  Respiratory: Negative for cough and shortness of breath.   Cardiovascular: Negative for chest pain and leg swelling.  Gastrointestinal: Negative for abdominal pain, diarrhea, nausea and vomiting.  Genitourinary: Negative for dysuria.  Musculoskeletal: Negative for back pain and neck pain.  Skin: Negative for rash.  Neurological: Positive for numbness. Negative for dizziness, facial asymmetry, speech difficulty, weakness, light-headedness and headaches.  Hematological: Does not bruise/bleed easily.  Psychiatric/Behavioral: Negative for confusion.     Physical Exam Updated Vital Signs BP 134/85   Pulse 67   Temp 98.8 F (37.1 C) (Oral)   Resp 18   Ht 1.702 m (5\' 7" )   Wt 99.8 kg   LMP 06/08/2018   SpO2 98%   BMI 34.46 kg/m   Physical Exam Vitals signs and nursing note reviewed.  Constitutional:      General: She is not in acute distress.    Appearance: Normal appearance. She is well-developed.  HENT:     Head: Normocephalic and atraumatic.     Mouth/Throat:     Mouth: Mucous membranes are moist.  Eyes:     Extraocular Movements: Extraocular movements intact.  Conjunctiva/sclera: Conjunctivae normal.     Pupils: Pupils are equal, round, and reactive to light.  Neck:     Musculoskeletal: Normal range of motion and neck supple. No neck rigidity.  Cardiovascular:     Rate and Rhythm: Normal rate and regular rhythm.     Heart sounds: Normal heart sounds. No murmur.  Pulmonary:     Effort: Pulmonary effort is normal. No respiratory distress.     Breath sounds: Normal breath sounds.  Abdominal:     General: Bowel sounds are normal.     Palpations: Abdomen is soft.     Tenderness: There is no abdominal tenderness.  Musculoskeletal: Normal range of  motion.        General: No swelling.  Skin:    General: Skin is warm and dry.  Neurological:     Mental Status: She is alert.     Sensory: Sensory deficit present.      ED Treatments / Results  Labs (all labs ordered are listed, but only abnormal results are displayed) Labs Reviewed  CBC WITH DIFFERENTIAL/PLATELET - Abnormal; Notable for the following components:      Result Value   WBC 3.7 (*)    Hemoglobin 11.8 (*)    MCH 24.7 (*)    All other components within normal limits  COMPREHENSIVE METABOLIC PANEL - Abnormal; Notable for the following components:   Glucose, Bld 100 (*)    Calcium 8.8 (*)    All other components within normal limits  URINALYSIS, ROUTINE W REFLEX MICROSCOPIC - Abnormal; Notable for the following components:   Color, Urine STRAW (*)    All other components within normal limits  RAPID URINE DRUG SCREEN, HOSP PERFORMED  I-STAT BETA HCG BLOOD, ED ([facility], [facility], [facility] ONLY)    EKG EKG Interpretation  Date/Time:  Monday June 22 2018 11:43:57 EST Ventricular Rate:  71 PR Interval:    QRS Duration: 100 QT Interval:  382 QTC Calculation: 416 R Axis:   51 Text Interpretation:  Sinus rhythm Confirmed by Fredia Sorrow [phone]) on 06/22/2018 11:51:27 AM   Radiology Ct Head Wo Contrast  Result Date: 06/22/2018 CLINICAL DATA:  Right-sided weakness.  Stroke suspected. EXAM: CT HEAD WITHOUT CONTRAST TECHNIQUE: Contiguous axial images were obtained from the base of the skull through the vertex without intravenous contrast. COMPARISON:  None. FINDINGS: Brain: No mass lesion, hemorrhage, hydrocephalus, acute infarct, intra-axial, or extra-axial fluid collection. Vascular: No hyperdense vessel or unexpected calcification. Skull: Normal Sinuses/Orbits: Normal imaged portions of the orbits and globes. Clear paranasal sinuses and mastoid air cells. Other: None. IMPRESSION: Normal head CT. Electronically Signed   By: Abigail Miyamoto M.D.   On: 06/22/2018 13:21   Mr Brain Wo  Contrast (neuro Protocol)  Result Date: 06/22/2018 CLINICAL DATA:  Right-sided weakness over the last day. EXAM: MRI HEAD WITHOUT CONTRAST TECHNIQUE: Multiplanar, multiecho pulse sequences of the brain and surrounding structures were obtained without intravenous contrast. COMPARISON:  Head CT same day FINDINGS: Brain: Diffusion imaging does not show any acute or subacute infarction. Brainstem and cerebellum are normal. Within the cerebral hemispheres, there are scattered foci of T2 and FLAIR signal within the white matter, more subcortical than deep. In this patient with a history of hypertension, the findings probably represent an early manifestation of small vessel change. Possibility of demyelinating disease is considered but not favored. No evidence of mass lesion, hemorrhage, hydrocephalus or extra-axial collection. Vascular: Major vessels at the base of the brain show flow. Skull and upper cervical spine:  Negative Sinuses/Orbits: Clear/normal Other: None IMPRESSION: No acute or subacute insult by diffusion imaging. Several scattered foci of T2 and FLAIR signal primarily within the subcortical white matter. In a patient with a history of hypertension, these are favored to represent an early manifestation of small vessel change. The differential diagnosis does include demyelinating disease. Electronically Signed   By: Nelson Chimes M.D.   On: 06/22/2018 14:32    Procedures Procedures (including critical care time)  Medications Ordered in ED Medications - No data to display   Initial Impression / Assessment and Plan / ED Course  I have reviewed the triage vital signs and the nursing notes.  Pertinent labs & imaging results that were available during my care of the patient were reviewed by me and considered in my medical decision making (see chart for details).    Patient presenting with the onset of numbness to the right side of the face at 1 in the morning.  Patient went to bed at 9:30 PM last  night.  She also had some numbness to her right great toe.  But she is had that before.  The right facial numbness is new.  No weakness no speech problems and no headache no visual problems.  Head CT was negative so MRI was done.  MRI shows no evidence of acute stroke or any tumors it does raise some question of some early demyelinization which could be secondary to hypertension which she does have a diagnosis of or could be secondary to a demyelinization disease process.  Patient informed of this.  Will be referred to neurology and back to her primary care doctor.  Patient stable for discharge home.  Labs without significant abnormalities.  No family history of multiple sclerosis.  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      Final Clinical Impressions(s) / ED Diagnoses   Final diagnoses:  Numbness    ED Discharge Orders    None       Fredia Sorrow, MD 06/22/18 1454

## 2018-06-22 NOTE — ED Triage Notes (Signed)
Patient complaining of waking up at 0100 this morning with numbness to right side of face and right leg. States she went to bed at 2130 last night. States numbness to right leg has resolved but still has "a little numbness to right face."

## 2018-06-22 NOTE — Telephone Encounter (Signed)
Pt woke up with right numbness in her face. She said also that her big toe was tingling. That was around 1 am. She went back to sleep and woke up around 7 and she still had facial numbness but her right leg was tingling. She went to work and it is slightly better but it still feels numb. She wanted to know what she needed to do as I told her we could see her tomorrow but that was the first available.

## 2018-06-22 NOTE — Telephone Encounter (Signed)
Spoke with patient and advised her to go to the closest ED for evaluation with verbal understanding.

## 2018-06-23 ENCOUNTER — Telehealth: Payer: Self-pay | Admitting: Family Medicine

## 2018-06-23 ENCOUNTER — Other Ambulatory Visit: Payer: Self-pay | Admitting: Family Medicine

## 2018-06-23 DIAGNOSIS — R9402 Abnormal brain scan: Secondary | ICD-10-CM

## 2018-06-23 DIAGNOSIS — R200 Anesthesia of skin: Secondary | ICD-10-CM

## 2018-06-23 NOTE — Telephone Encounter (Signed)
Called pt to schedule appt. She said she would call back to make the appointment. She did say that she is fine with the referral to neurology and the hospital had something about Dr. Merlene Laughter and if that was ok with you she was ok with that or whichever you recommended would be ok.

## 2018-06-23 NOTE — Telephone Encounter (Signed)
Referral put in for [facility], tried to call the patient , her voicemail is full, Kathleen Argue will call the PT for appointment

## 2018-06-23 NOTE — Telephone Encounter (Signed)
I tried calling pt for ED follow up and to help with Neurology appt that was recommended, her mailbox is full, cannot leave a message, if she takes pt messages I will send one also if she calls let her know I would like to refer her to Neurologist as recommended in the Ed , and have entered a referral to Neurology in [place], if that is a problem please change referral to Neurologist of her choice  Also needs appt scheduled with me

## 2018-06-23 NOTE — Progress Notes (Signed)
Ab neuro

## 2018-06-23 NOTE — Telephone Encounter (Signed)
Either is fine ,so please schedule neurology [facility], likely sooner and they are in the system with more Docs available and provide her with appt info, or to ex[pect a call from the office , wghatever you gather, hopefully since she has recently been in ED with imaging the referral will be easier to arrange

## 2018-09-22 ENCOUNTER — Ambulatory Visit: Payer: PRIVATE HEALTH INSURANCE | Admitting: Family Medicine

## 2018-10-19 ENCOUNTER — Other Ambulatory Visit: Payer: PRIVATE HEALTH INSURANCE

## 2018-10-19 ENCOUNTER — Other Ambulatory Visit: Payer: Self-pay

## 2018-10-19 ENCOUNTER — Ambulatory Visit: Payer: PRIVATE HEALTH INSURANCE | Admitting: Family Medicine

## 2018-10-19 ENCOUNTER — Encounter: Payer: PRIVATE HEALTH INSURANCE | Admitting: Family Medicine

## 2018-10-19 DIAGNOSIS — Z20822 Contact with and (suspected) exposure to covid-19: Secondary | ICD-10-CM

## 2018-10-19 NOTE — Progress Notes (Signed)
lav

## 2018-10-24 LAB — NOVEL CORONAVIRUS, NAA: SARS-CoV-2, NAA: NOT DETECTED

## 2018-10-27 ENCOUNTER — Telehealth: Payer: Self-pay

## 2018-10-27 NOTE — Telephone Encounter (Signed)
Pt called back and results given that covid 19 not detected. Discussed covid 19 signs and symptoms to look out for and to call 911 for worsening respiratory issues/distress. Pt verbalized understanding.

## 2018-11-12 ENCOUNTER — Encounter: Payer: PRIVATE HEALTH INSURANCE | Admitting: Family Medicine

## 2018-11-26 ENCOUNTER — Encounter: Payer: PRIVATE HEALTH INSURANCE | Admitting: Family Medicine

## 2018-12-03 ENCOUNTER — Encounter: Payer: Self-pay | Admitting: Family Medicine

## 2018-12-03 ENCOUNTER — Other Ambulatory Visit: Payer: Self-pay

## 2018-12-03 ENCOUNTER — Other Ambulatory Visit (HOSPITAL_COMMUNITY)
Admission: RE | Admit: 2018-12-03 | Discharge: 2018-12-03 | Disposition: A | Discharge status: Home / Self Care | Payer: PRIVATE HEALTH INSURANCE | Source: Ambulatory Visit | Attending: Family Medicine | Admitting: Family Medicine

## 2018-12-03 ENCOUNTER — Ambulatory Visit (INDEPENDENT_AMBULATORY_CARE_PROVIDER_SITE_OTHER): Payer: PRIVATE HEALTH INSURANCE | Admitting: Family Medicine

## 2018-12-03 VITALS — BP 110/78 | HR 70 | Temp 97.9°F | Resp 14 | Ht 67.0 in | Wt 185.0 lb

## 2018-12-03 DIAGNOSIS — J310 Chronic rhinitis: Secondary | ICD-10-CM | POA: Diagnosis not present

## 2018-12-03 DIAGNOSIS — F320 Major depressive disorder, single episode, mild: Secondary | ICD-10-CM

## 2018-12-03 DIAGNOSIS — Z124 Encounter for screening for malignant neoplasm of cervix: Secondary | ICD-10-CM | POA: Insufficient documentation

## 2018-12-03 DIAGNOSIS — F32A Depression, unspecified: Secondary | ICD-10-CM

## 2018-12-03 DIAGNOSIS — N760 Acute vaginitis: Secondary | ICD-10-CM | POA: Diagnosis not present

## 2018-12-03 DIAGNOSIS — Z0000 Encounter for general adult medical examination without abnormal findings: Secondary | ICD-10-CM

## 2018-12-03 DIAGNOSIS — J300 Vasomotor rhinitis: Secondary | ICD-10-CM

## 2018-12-03 NOTE — Patient Instructions (Signed)
F/U with MD in 3.5 months, call if you need me sooner  You are being referred to Therapist, and the office will call you  Specimens sent for testing  CONGRATS on weight loss.  Continue to work on all aspects of your health and thios wil;l improve in all areas  Thanks for choosing [facility], we consider it a privelige to serve you.

## 2018-12-03 NOTE — Progress Notes (Signed)
    Kendra Wong     MRN: [id number]      DOB: 04-16-1976  HPI: Patient is in for annual physical exam. C/o dry hacking cough following trip to the [place] for 4 days, no fever, chills , sputum, no known covid 19 exposure. Tearful and distressed , concerned about possible infidelity of her partner  of 25 years , who denies it , she has gone as far getting a Games developer, is interested in and needs personal counselling Requests STD testing Immunization is reviewed , and  updated if needed.   PE: Pulse 70   Temp 97.9 F (36.6 C) (Temporal)   Resp 14   Ht 5\' 7"  (1.702 m)   Wt 185 lb (83.9 kg)   LMP 11/04/2018 (Approximate)   SpO2 97%   BMI 28.98 kg/m   Pleasant  female, alert and oriented x 3, in no cardio-pulmonary distress. Afebrile. HEENT No facial trauma or asymetry. Sinuses non tender.  Extra occullar muscles intact, External ears normal, tympanic membranes clear. . Neck: supple, no adenopathy,JVD or thyromegaly.No bruits.  Chest: Clear to ascultation bilaterally.No crackles or wheezes. Non tender to palpation  Breast: No asymetry,no masses or lumps. No tenderness. No nipple discharge or inversion. No axillary or supraclavicular adenopathy  Cardiovascular system; Heart sounds normal,  S1 and  S2 ,no S3.  No murmur, or thrill. Apical beat not displaced Peripheral pulses normal.  Abdomen: Soft, non tender, no organomegaly or masses. No bruits. Bowel sounds normal. No guarding, tenderness or rebound.  .  GU: External genitalia normal female genitalia , normal female distribution of hair. No lesions. Urethral meatus normal in size, no  Prolapse, no lesions visibly  Present. Bladder non tender. Vagina pink and moist , with no visible lesions , discharge present . Adequate pelvic support no  cystocele or rectocele noted Cervix pink and appears healthy, no lesions or ulcerations noted, no discharge noted from os Uterus normal size, no adnexal masses, no  cervical motion or adnexal tenderness.   Musculoskeletal exam: Full ROM of spine, hips , shoulders and knees. No deformity ,swelling or crepitus noted. No muscle wasting or atrophy.   Neurologic: Cranial nerves 2 to 12 intact. Power, tone ,sensation and reflexes normal throughout. No disturbance in gait. No tremor.  Skin: Intact, no ulceration, erythema , scaling or rash noted. Pigmentation normal throughout  Psych; Flat mood and sad tearful affect. Judgement and concentration normal   Assessment & Plan:  Annual physical exam Annual exam as documented. Counseling done  re healthy lifestyle involving commitment to 150 minutes exercise per week, heart healthy diet, and attaining healthy weight.The importance of adequate sleep also discussed. Regular seat belt use and home safety, is also discussed. Changes in health habits are decided on by the patient with goals and time frames  set for achieving them. Immunization and cancer screening needs are specifically addressed at this visit.   Mild episode of depression ([place]) Needs in person therapy and agrees , will refer to [facility]  Vaginitis and vulvovaginitis Exam is normal however specimens are sent for STD testing based on pt's concerns  Rhinitis Advised short term use of sudafed and OTC allergy med like zyrtec as needed

## 2018-12-06 DIAGNOSIS — F320 Major depressive disorder, single episode, mild: Secondary | ICD-10-CM | POA: Insufficient documentation

## 2018-12-06 DIAGNOSIS — F32A Depression, unspecified: Secondary | ICD-10-CM | POA: Insufficient documentation

## 2018-12-06 DIAGNOSIS — J310 Chronic rhinitis: Secondary | ICD-10-CM | POA: Insufficient documentation

## 2018-12-06 DIAGNOSIS — N760 Acute vaginitis: Secondary | ICD-10-CM | POA: Insufficient documentation

## 2018-12-06 NOTE — Assessment & Plan Note (Signed)
Advised short term use of sudafed and OTC allergy med like zyrtec as needed

## 2018-12-06 NOTE — Assessment & Plan Note (Signed)
Exam is normal however specimens are sent for STD testing based on pt's concerns

## 2018-12-06 NOTE — Assessment & Plan Note (Signed)

## 2018-12-06 NOTE — Assessment & Plan Note (Signed)
Needs in person therapy and agrees , will refer to [facility]

## 2018-12-08 ENCOUNTER — Telehealth: Payer: Self-pay | Admitting: Family Medicine

## 2018-12-08 NOTE — Telephone Encounter (Signed)
Please call the pt with results

## 2018-12-08 NOTE — Telephone Encounter (Signed)
Spoke with patient and let her know that no results are back yet. She verbalized understanding. Let her know we would call as soon as we get any results back.

## 2018-12-09 LAB — CYTOLOGY - PAP
Diagnosis: NEGATIVE
HPV: NOT DETECTED

## 2018-12-10 ENCOUNTER — Encounter: Payer: Self-pay | Admitting: Family Medicine

## 2018-12-10 LAB — CERVICOVAGINAL ANCILLARY ONLY
Bacterial vaginitis: POSITIVE — AB
Candida vaginitis: NEGATIVE
Chlamydia: NEGATIVE
Neisseria Gonorrhea: NEGATIVE
Trichomonas: NEGATIVE

## 2018-12-11 ENCOUNTER — Encounter: Payer: Self-pay | Admitting: Family Medicine

## 2019-03-22 ENCOUNTER — Ambulatory Visit: Payer: PRIVATE HEALTH INSURANCE | Admitting: Family Medicine

## 2019-04-02 ENCOUNTER — Other Ambulatory Visit: Payer: Self-pay

## 2019-04-02 DIAGNOSIS — Z20822 Contact with and (suspected) exposure to covid-19: Secondary | ICD-10-CM

## 2019-04-03 LAB — NOVEL CORONAVIRUS, NAA: SARS-CoV-2, NAA: NOT DETECTED

## 2019-04-05 ENCOUNTER — Other Ambulatory Visit: Payer: Self-pay

## 2019-04-05 ENCOUNTER — Ambulatory Visit (INDEPENDENT_AMBULATORY_CARE_PROVIDER_SITE_OTHER): Payer: PRIVATE HEALTH INSURANCE | Admitting: Otolaryngology

## 2019-06-04 ENCOUNTER — Encounter: Payer: Self-pay | Admitting: Family Medicine

## 2019-06-07 ENCOUNTER — Other Ambulatory Visit: Payer: Self-pay | Admitting: *Deleted

## 2019-06-07 ENCOUNTER — Encounter: Payer: Self-pay | Admitting: Family Medicine

## 2019-06-07 ENCOUNTER — Other Ambulatory Visit: Payer: Self-pay

## 2019-06-07 ENCOUNTER — Telehealth (INDEPENDENT_AMBULATORY_CARE_PROVIDER_SITE_OTHER): Payer: PRIVATE HEALTH INSURANCE | Admitting: Family Medicine

## 2019-06-07 VITALS — BP 110/78 | Ht 67.0 in | Wt 170.0 lb

## 2019-06-07 DIAGNOSIS — E663 Overweight: Secondary | ICD-10-CM

## 2019-06-07 DIAGNOSIS — R102 Pelvic and perineal pain: Secondary | ICD-10-CM | POA: Diagnosis not present

## 2019-06-07 DIAGNOSIS — Z1231 Encounter for screening mammogram for malignant neoplasm of breast: Secondary | ICD-10-CM

## 2019-06-07 DIAGNOSIS — M25551 Pain in right hip: Secondary | ICD-10-CM

## 2019-06-07 DIAGNOSIS — M25552 Pain in left hip: Secondary | ICD-10-CM | POA: Diagnosis not present

## 2019-06-07 DIAGNOSIS — E559 Vitamin D deficiency, unspecified: Secondary | ICD-10-CM

## 2019-06-07 DIAGNOSIS — D72819 Decreased white blood cell count, unspecified: Secondary | ICD-10-CM

## 2019-06-07 DIAGNOSIS — E785 Hyperlipidemia, unspecified: Secondary | ICD-10-CM

## 2019-06-07 DIAGNOSIS — M5442 Lumbago with sciatica, left side: Secondary | ICD-10-CM

## 2019-06-07 DIAGNOSIS — F320 Major depressive disorder, single episode, mild: Secondary | ICD-10-CM

## 2019-06-07 DIAGNOSIS — F32A Depression, unspecified: Secondary | ICD-10-CM

## 2019-06-07 DIAGNOSIS — I10 Essential (primary) hypertension: Secondary | ICD-10-CM

## 2019-06-07 DIAGNOSIS — G8929 Other chronic pain: Secondary | ICD-10-CM

## 2019-06-07 NOTE — Patient Instructions (Addendum)
F/u in office with MD , call if you need me before   You are overdue with mammogram, an appointment is scheduled  You ae refderred to [facility]ne[facility]) re chronic pelvic pain  You are referred to [facility] in [place] re chronic hip and back pain  Please get fasting CBC, lipid, cmp and EGFr, TSH,Vit D and HBA1C ( [facility] ) as soon a spossible  Please let me know if you decide on hematology referral re low white cell count  Covid vaccine IS recommended  .It is important that you exercise regularly at least 30 minutes 5 times a week. If you develop chest pain, have severe difficulty breathing, or feel very tired, stop exercising immediately and seek medical attention  Think about what you will eat, plan ahead. Choose " clean, green, fresh or frozen" over canned, processed or packaged foods which are more sugary, salty and fatty. 70 to 75% of food eaten should be vegetables and fruit. Three meals at set times with snacks allowed between meals, but they must be fruit or vegetables. Aim to eat over a 12 hour period , example 7 am to 7 pm, and STOP after  your last meal of the day. Drink water,generally about 64 ounces per day, no other drink is as healthy. Fruit juice is best enjoyed in a healthy way, by EATING the fruit. Thanks for choosing [facility], we consider it a privelige to serve you.

## 2019-06-07 NOTE — Progress Notes (Signed)
Virtual Visit via Telephone Note  I connected with Kendra Wong on 06/07/19 at  2:00 PM EST by telephone and verified that I am speaking with the correct person using two identifiers.  Location: Patient: home Provider: office   I discussed the limitations, risks, security and privacy concerns of performing an evaluation and management service by telephone and the availability of in person appointments. I also discussed with the patient that there may be a patient responsible charge related to this service. The patient expressed understanding and agreed to proceed.   History of Present Illness: F/U chronic problems, medication review, and refill medication when necessary. Review most recent labs and order labs which are due Review preventive health and update with necessary referrals or immunizations as indicated C/o generalized body aches, low back , hips, and pelvic pain, no inciting trauma, no relieving factors when there is a flare, wants help with this symptom C/o pelvic pain, denies discharge, denies dyspareunia Denies depression, still has concerns about her spouse , but states she is handling this situation in a much debilitating / self destructive way. No interest in or perceived need for therapy on her part, and screen for depression has no indication tha medications are needed   Observations/Objective: BP 110/78   Ht 5\' 7"  (1.702 m)   Wt 170 lb (77.1 kg)   BMI 26.63 kg/m  Good communication with no confusion and intact memory. Alert and oriented x 3 No signs of respiratory distress during speech    Assessment and Plan: Pelvic pain Chronic pelvic pain, not associated with discharge or sexual activity, refer to Gyne for eval and management  Chronic left hip pain Chronic and disabling when she has a pain flare, refer to Ortho  Back pain Over 8 month h/o back pain which makes raising up from bed difficult, needs Ortho evaluation, X ray not very revealing as to cause of  symptoms reported and the severity  Mild episode of depression ([facility]) Marked improvement , still leaving the door open for cpounselling however ,. Pt not currently interested  Overweight (BMI 25.0-29.9)  Patient re-educated about  the importance of commitment to a  minimum of 150 minutes of exercise per week as able.  The importance of healthy food choices with portion control discussed, as well as eating regularly and within a 12 hour window most days. The need to choose "clean , green" food 50 to 75% of the time is discussed, as well as to make water the primary drink and set a goal of 64 ounces water daily.    Weight /BMI 06/07/2019 12/03/2018 06/22/2018  WEIGHT 170 lb 185 lb 220 lb  HEIGHT 5\' 7"  5\' 7"  5\' 7"   BMI 26.63 kg/m2 28.98 kg/m2 34.46 kg/m2   Marked improvement , she is applauded on this       Follow Up Instructions:    I discussed the assessment and treatment plan with the patient. The patient was provided an opportunity to ask questions and all were answered. The patient agreed with the plan and demonstrated an understanding of the instructions.   The patient was advised to call back or seek an in-person evaluation if the symptoms worsen or if the condition fails to improve as anticipated.  I provided 20 minutes of non-face-to-face time during this encounter.   Tula Nakayama, MD

## 2019-06-15 ENCOUNTER — Encounter: Payer: Self-pay | Admitting: Family Medicine

## 2019-06-15 DIAGNOSIS — E663 Overweight: Secondary | ICD-10-CM | POA: Insufficient documentation

## 2019-06-15 NOTE — Assessment & Plan Note (Signed)
Over 8 month h/o back pain which makes raising up from bed difficult, needs Ortho evaluation, X ray not very revealing as to cause of symptoms reported and the severity

## 2019-06-15 NOTE — Assessment & Plan Note (Signed)
Chronic pelvic pain, not associated with discharge or sexual activity, refer to [facility] for eval and management

## 2019-06-15 NOTE — Assessment & Plan Note (Signed)
Marked improvement , still leaving the door open for cpounselling however ,. Pt not currently interested

## 2019-06-15 NOTE — Assessment & Plan Note (Signed)
  Patient re-educated about  the importance of commitment to a  minimum of 150 minutes of exercise per week as able.  The importance of healthy food choices with portion control discussed, as well as eating regularly and within a 12 hour window most days. The need to choose "clean , green" food 50 to 75% of the time is discussed, as well as to make water the primary drink and set a goal of 64 ounces water daily.    Weight /BMI 06/07/2019 12/03/2018 06/22/2018  WEIGHT 170 lb 185 lb 220 lb  HEIGHT 5\' 7"  5\' 7"  5\' 7"   BMI 26.63 kg/m2 28.98 kg/m2 34.46 kg/m2   Marked improvement , she is applauded on this

## 2019-06-15 NOTE — Assessment & Plan Note (Signed)
Chronic and disabling when she has a pain flare, refer to Ortho

## 2019-07-01 ENCOUNTER — Encounter: Payer: Self-pay | Admitting: Family Medicine

## 2019-07-01 LAB — COMPLETE METABOLIC PANEL WITH GFR
AG Ratio: 1.6 (calc) (ref 1.0–2.5)
ALT: 13 U/L (ref 6–29)
AST: 18 U/L (ref 10–30)
Albumin: 4.1 g/dL (ref 3.6–5.1)
Alkaline phosphatase (APISO): 42 U/L (ref 31–125)
BUN: 13 mg/dL (ref 7–25)
CO2: 29 mmol/L (ref 20–32)
Calcium: 9.2 mg/dL (ref 8.6–10.2)
Chloride: 102 mmol/L (ref 98–110)
Creat: 0.73 mg/dL (ref 0.50–1.10)
GFR, Est African American: 117 mL/min/{1.73_m2} (ref >=60)
GFR, Est Non African American: 101 mL/min/{1.73_m2} (ref >=60)
Globulin: 2.5 g/dL (calc) (ref 1.9–3.7)
Glucose, Bld: 90 mg/dL (ref 65–99)
Potassium: 4.2 mmol/L (ref 3.5–5.3)
Sodium: 138 mmol/L (ref 135–146)
Total Bilirubin: 0.6 mg/dL (ref 0.2–1.2)
Total Protein: 6.6 g/dL (ref 6.1–8.1)

## 2019-07-01 LAB — LIPID PANEL
Cholesterol: 178 mg/dL (ref <200)
HDL: 61 mg/dL (ref >=50)
LDL Cholesterol (Calc): 103 mg/dL (calc) — ABNORMAL HIGH
Non-HDL Cholesterol (Calc): 117 mg/dL (calc) (ref <130)
Total CHOL/HDL Ratio: 2.9 (calc) (ref <5.0)
Triglycerides: 46 mg/dL (ref <150)

## 2019-07-01 LAB — VITAMIN D 25 HYDROXY (VIT D DEFICIENCY, FRACTURES): Vit D, 25-Hydroxy: 14 ng/mL — ABNORMAL LOW (ref 30–100)

## 2019-07-01 LAB — CBC
HCT: 37.9 % (ref 35.0–45.0)
Hemoglobin: 12.1 g/dL (ref 11.7–15.5)
MCH: 26.6 pg — ABNORMAL LOW (ref 27.0–33.0)
MCHC: 31.9 g/dL — ABNORMAL LOW (ref 32.0–36.0)
MCV: 83.3 fL (ref 80.0–100.0)
MPV: 11.2 fL (ref 7.5–12.5)
Platelets: 212 10*3/uL (ref 140–400)
RBC: 4.55 10*6/uL (ref 3.80–5.10)
RDW: 13 % (ref 11.0–15.0)
WBC: 3.5 10*3/uL — ABNORMAL LOW (ref 3.8–10.8)

## 2019-07-01 LAB — TSH: TSH: 1.03 mIU/L

## 2019-07-02 ENCOUNTER — Other Ambulatory Visit: Payer: Self-pay | Admitting: Family Medicine

## 2019-07-02 ENCOUNTER — Ambulatory Visit (HOSPITAL_COMMUNITY): Payer: PRIVATE HEALTH INSURANCE

## 2019-07-02 ENCOUNTER — Encounter: Payer: Self-pay | Admitting: Family Medicine

## 2019-07-02 MED ORDER — ERGOCALCIFEROL 1.25 MG (50000 UT) PO CAPS: 50000.0000 [IU] | ORAL_CAPSULE | ORAL | 2 refills | Status: DC

## 2019-07-05 ENCOUNTER — Ambulatory Visit (HOSPITAL_COMMUNITY)
Admission: RE | Admit: 2019-07-05 | Discharge: 2019-07-05 | Disposition: A | Discharge status: Home / Self Care | Payer: PRIVATE HEALTH INSURANCE | Source: Ambulatory Visit | Attending: Family Medicine | Admitting: Family Medicine

## 2019-07-05 ENCOUNTER — Other Ambulatory Visit: Payer: Self-pay

## 2019-07-05 DIAGNOSIS — Z1231 Encounter for screening mammogram for malignant neoplasm of breast: Secondary | ICD-10-CM

## 2019-09-06 ENCOUNTER — Ambulatory Visit: Payer: PRIVATE HEALTH INSURANCE | Admitting: Family Medicine

## 2019-10-05 ENCOUNTER — Ambulatory Visit: Payer: PRIVATE HEALTH INSURANCE | Attending: Internal Medicine

## 2019-10-05 ENCOUNTER — Other Ambulatory Visit: Payer: Self-pay

## 2019-10-05 DIAGNOSIS — Z20822 Contact with and (suspected) exposure to covid-19: Secondary | ICD-10-CM

## 2019-10-06 LAB — NOVEL CORONAVIRUS, NAA: SARS-CoV-2, NAA: NOT DETECTED

## 2019-10-06 LAB — SARS-COV-2, NAA 2 DAY TAT

## 2019-12-21 ENCOUNTER — Encounter: Payer: Self-pay | Admitting: Family Medicine

## 2019-12-21 ENCOUNTER — Other Ambulatory Visit: Payer: Self-pay

## 2019-12-21 ENCOUNTER — Ambulatory Visit (INDEPENDENT_AMBULATORY_CARE_PROVIDER_SITE_OTHER): Payer: PRIVATE HEALTH INSURANCE | Admitting: Family Medicine

## 2019-12-21 ENCOUNTER — Other Ambulatory Visit (HOSPITAL_COMMUNITY)
Admission: RE | Admit: 2019-12-21 | Discharge: 2019-12-21 | Disposition: A | Discharge status: Home / Self Care | Payer: PRIVATE HEALTH INSURANCE | Source: Ambulatory Visit | Attending: Family Medicine | Admitting: Family Medicine

## 2019-12-21 VITALS — BP 143/93 | HR 68 | Resp 16 | Ht 67.0 in | Wt 196.0 lb

## 2019-12-21 DIAGNOSIS — Z124 Encounter for screening for malignant neoplasm of cervix: Secondary | ICD-10-CM | POA: Insufficient documentation

## 2019-12-21 DIAGNOSIS — R0683 Snoring: Secondary | ICD-10-CM

## 2019-12-21 DIAGNOSIS — N760 Acute vaginitis: Secondary | ICD-10-CM

## 2019-12-21 DIAGNOSIS — Z1159 Encounter for screening for other viral diseases: Secondary | ICD-10-CM

## 2019-12-21 DIAGNOSIS — Z0000 Encounter for general adult medical examination without abnormal findings: Secondary | ICD-10-CM

## 2019-12-21 DIAGNOSIS — E669 Obesity, unspecified: Secondary | ICD-10-CM

## 2019-12-21 DIAGNOSIS — R030 Elevated blood-pressure reading, without diagnosis of hypertension: Secondary | ICD-10-CM

## 2019-12-21 NOTE — Assessment & Plan Note (Signed)
Specimens sent for testing

## 2019-12-21 NOTE — Patient Instructions (Signed)
F/U jn office with MD , re evaluate blood pressure and weight in early February, call if you need me sooner  Labs today, Hep c screen, HIV and RPR  I am concerned about your blood pressure, weight loss and regular exercise will lower it, if it remains high at next visit, you will need medication to lower it   You are referred for evaluation for sleep apnea  Thankful that you have decided on getting Covid vaccines, this will help to protect you, the people you care about most and society at large!    It is important that you exercise regularly at least 30 minutes 5 times a week. If you develop chest pain, have severe difficulty breathing, or feel very tired, stop exercising immediately and seek medical attention  Think about what you will eat, plan ahead. Choose " clean, green, fresh or frozen" over canned, processed or packaged foods which are more sugary, salty and fatty. 70 to 75% of food eaten should be vegetables and fruit. Three meals at set times with snacks allowed between meals, but they must be fruit or vegetables. Aim to eat over a 12 hour period , example 7 am to 7 pm, and STOP after  your last meal of the day. Drink water,generally about 64 ounces per day, no other drink is as healthy. Fruit juice is best enjoyed in a healthy way, by EATING the fruit. Thanks for choosing [facility], we consider it a privelige to serve you.

## 2019-12-21 NOTE — Assessment & Plan Note (Signed)

## 2019-12-22 LAB — RPR: RPR Ser Ql: NONREACTIVE

## 2019-12-22 LAB — CERVICOVAGINAL ANCILLARY ONLY
Bacterial Vaginitis (gardnerella): NEGATIVE
Candida Glabrata: NEGATIVE
Candida Vaginitis: NEGATIVE
Chlamydia: NEGATIVE
Comment: NEGATIVE
Comment: NEGATIVE
Comment: NEGATIVE
Comment: NEGATIVE
Comment: NEGATIVE
Comment: NORMAL
Neisseria Gonorrhea: NEGATIVE
Trichomonas: NEGATIVE

## 2019-12-22 LAB — HEPATITIS C ANTIBODY: Hep C Virus Ab: <0.1 s/co ratio (ref 0.0–0.9)

## 2019-12-22 LAB — HIV ANTIBODY (ROUTINE TESTING W REFLEX): HIV Screen 4th Generation wRfx: NONREACTIVE

## 2019-12-22 LAB — CYTOLOGY - PAP
Comment: NEGATIVE
Diagnosis: NEGATIVE
High risk HPV: NEGATIVE

## 2019-12-24 ENCOUNTER — Encounter: Payer: Self-pay | Admitting: Family Medicine

## 2019-12-24 NOTE — Assessment & Plan Note (Signed)
Reports snoring and fatigue and l has elevated bP, refer for testing for OSA

## 2019-12-24 NOTE — Assessment & Plan Note (Signed)
  Patient re-educated about  the importance of commitment to a  minimum of 150 minutes of exercise per week as able.  The importance of healthy food choices with portion control discussed, as well as eating regularly and within a 12 hour window most days. The need to choose "clean , green" food 50 to 75% of the time is discussed, as well as to make water the primary drink and set a goal of 64 ounces water daily.    Weight /BMI 12/21/2019 06/07/2019 12/03/2018  WEIGHT 196 lb 170 lb 185 lb  HEIGHT 5\' 7"  5\' 7"  5\' 7"   BMI 30.7 kg/m2 26.63 kg/m2 28.98 kg/m2

## 2019-12-24 NOTE — Assessment & Plan Note (Signed)
DASH diet and commitment to daily physical activity for a minimum of 30 minutes discussed and encouraged, as a part of hypertension management. The importance of attaining a healthy weight is also discussed.  BP/Weight 12/21/2019 06/07/2019 12/03/2018 06/22/2018 01/08/2018 09/14/8240 01/06/8068  Systolic BP 996 722 773 750 510 712 524  Diastolic BP 93 78 78 84 90 92 80  Wt. (Lbs) 196 170 185 220 210 213 207.75  BMI 30.7 26.63 28.98 34.46 32.89 33.36 32.54     Re eval in 5 monhts

## 2019-12-24 NOTE — Progress Notes (Signed)
Kendra Wong     MRN: [id number]      DOB: 1976-01-24  HPI: Patient is in for annual physical exam. C./o snoring and fatigue Requests STD testing  Immunization is reviewed ,  Deferring on influenza vaccine, but plans to get covid vaccines   PE: BP (!) 143/93   Pulse 68   Resp 16   Ht 5\' 7"  (1.702 m)   Wt 196 lb (88.9 kg)   LMP 11/27/2019 (Exact Date)   SpO2 98%   BMI 30.70 kg/m   Pleasant  female, alert and oriented x 3, in no cardio-pulmonary distress. Afebrile. HEENT No facial trauma or asymetry. Sinuses non tender.  Extra occullar muscles intact.. External ears normal, . Neck: supple, no adenopathy,JVD or thyromegaly.No bruits.  Chest: Clear to ascultation bilaterally.No crackles or wheezes. Non tender to palpation  Breast: No asymetry,no masses or lumps. No tenderness. No nipple discharge or inversion. No axillary or supraclavicular adenopathy  Cardiovascular system; Heart sounds normal,  S1 and  S2 ,no S3.  No murmur, or thrill. Apical beat not displaced Peripheral pulses normal.  Abdomen: Soft, non tender, no organomegaly or masses. No bruits. Bowel sounds normal. No guarding, tenderness or rebound.   GU: External genitalia normal female genitalia , normal female distribution of hair. No lesions. Urethral meatus normal in size, no  Prolapse, no lesions visibly  Present. Bladder non tender. Vagina pink and moist , with no visible lesions , discharge present . Adequate pelvic support no  cystocele or rectocele noted Cervix pink and appears healthy, no lesions or ulcerations noted, no discharge noted from os Uterus normal size, no adnexal masses, no cervical motion or adnexal tenderness.   Musculoskeletal exam: Full ROM of spine, hips , shoulders and knees. No deformity ,swelling or crepitus noted. No muscle wasting or atrophy.   Neurologic: Cranial nerves 2 to 12 intact. Power, tone ,sensation and reflexes normal throughout. No disturbance  in gait. No tremor.  Skin: Intact, no ulceration, erythema , scaling or rash noted. Pigmentation normal throughout  Psych; Normal mood and affect. Judgement and concentration normal   Assessment & Plan:  Annual physical exam Annual exam as documented. Counseling done  re healthy lifestyle involving commitment to 150 minutes exercise per week, heart healthy diet, and attaining healthy weight.The importance of adequate sleep also discussed. Regular seat belt use and home safety, is also discussed. Changes in health habits are decided on by the patient with goals and time frames  set for achieving them. Immunization and cancer screening needs are specifically addressed at this visit.   Vulvovaginitis Specimens sent for testing  Elevated blood pressure reading in office without diagnosis of hypertension DASH diet and commitment to daily physical activity for a minimum of 30 minutes discussed and encouraged, as a part of hypertension management. The importance of attaining a healthy weight is also discussed.  BP/Weight 12/21/2019 06/07/2019 12/03/2018 06/22/2018 01/08/2018 0/62/3762 11/30/1515  Systolic BP 616 073 710 626 948 546 270  Diastolic BP 93 78 78 84 90 92 80  Wt. (Lbs) 196 170 185 220 210 213 207.75  BMI 30.7 26.63 28.98 34.46 32.89 33.36 32.54     Re eval in 5 monhts  Obesity (BMI 30.0-34.9)  Patient re-educated about  the importance of commitment to a  minimum of 150 minutes of exercise per week as able.  The importance of healthy food choices with portion control discussed, as well as eating regularly and within a 12 hour window most days. The  need to choose "clean , green" food 50 to 75% of the time is discussed, as well as to make water the primary drink and set a goal of 64 ounces water daily.    Weight /BMI 12/21/2019 06/07/2019 12/03/2018  WEIGHT 196 lb 170 lb 185 lb  HEIGHT 5\' 7"  5\' 7"  5\' 7"   BMI 30.7 kg/m2 26.63 kg/m2 28.98 kg/m2      Snoring Reports snoring and  fatigue and l has elevated bP, refer for testing for OSA

## 2020-01-17 ENCOUNTER — Other Ambulatory Visit: Payer: Self-pay | Admitting: Critical Care Medicine

## 2020-01-17 ENCOUNTER — Other Ambulatory Visit: Payer: Self-pay

## 2020-01-17 ENCOUNTER — Other Ambulatory Visit: Payer: PRIVATE HEALTH INSURANCE

## 2020-01-17 DIAGNOSIS — Z20822 Contact with and (suspected) exposure to covid-19: Secondary | ICD-10-CM

## 2020-01-20 LAB — NOVEL CORONAVIRUS, NAA: SARS-CoV-2, NAA: NOT DETECTED

## 2020-01-20 LAB — SARS-COV-2, NAA 2 DAY TAT

## 2020-01-20 LAB — SPECIMEN STATUS REPORT

## 2020-01-25 ENCOUNTER — Other Ambulatory Visit: Payer: Self-pay

## 2020-01-25 ENCOUNTER — Other Ambulatory Visit: Payer: PRIVATE HEALTH INSURANCE

## 2020-01-25 DIAGNOSIS — Z20822 Contact with and (suspected) exposure to covid-19: Secondary | ICD-10-CM

## 2020-01-26 LAB — SARS-COV-2, NAA 2 DAY TAT

## 2020-01-26 LAB — NOVEL CORONAVIRUS, NAA: SARS-CoV-2, NAA: NOT DETECTED

## 2020-02-08 ENCOUNTER — Other Ambulatory Visit: Payer: Self-pay

## 2020-02-08 ENCOUNTER — Other Ambulatory Visit: Payer: PRIVATE HEALTH INSURANCE

## 2020-02-08 DIAGNOSIS — Z20822 Contact with and (suspected) exposure to covid-19: Secondary | ICD-10-CM

## 2020-02-09 LAB — NOVEL CORONAVIRUS, NAA: SARS-CoV-2, NAA: NOT DETECTED

## 2020-02-09 LAB — SPECIMEN STATUS REPORT

## 2020-02-09 LAB — SARS-COV-2, NAA 2 DAY TAT

## 2020-02-17 ENCOUNTER — Other Ambulatory Visit: Payer: PRIVATE HEALTH INSURANCE

## 2020-02-17 ENCOUNTER — Other Ambulatory Visit: Payer: Self-pay

## 2020-02-17 DIAGNOSIS — Z20822 Contact with and (suspected) exposure to covid-19: Secondary | ICD-10-CM

## 2020-02-18 LAB — NOVEL CORONAVIRUS, NAA: SARS-CoV-2, NAA: NOT DETECTED

## 2020-02-18 LAB — SARS-COV-2, NAA 2 DAY TAT

## 2020-02-18 LAB — SPECIMEN STATUS REPORT

## 2020-02-24 ENCOUNTER — Other Ambulatory Visit: Payer: Self-pay

## 2020-02-24 ENCOUNTER — Other Ambulatory Visit: Payer: PRIVATE HEALTH INSURANCE

## 2020-02-24 DIAGNOSIS — Z20822 Contact with and (suspected) exposure to covid-19: Secondary | ICD-10-CM

## 2020-02-25 LAB — SPECIMEN STATUS REPORT

## 2020-02-25 LAB — NOVEL CORONAVIRUS, NAA: SARS-CoV-2, NAA: NOT DETECTED

## 2020-02-25 LAB — SARS-COV-2, NAA 2 DAY TAT

## 2020-05-23 ENCOUNTER — Encounter: Payer: Self-pay | Admitting: Family Medicine

## 2020-05-25 NOTE — Telephone Encounter (Signed)
Left another message.

## 2020-06-20 ENCOUNTER — Ambulatory Visit: Payer: PRIVATE HEALTH INSURANCE | Admitting: Family Medicine

## 2020-07-05 ENCOUNTER — Telehealth: Payer: PRIVATE HEALTH INSURANCE | Admitting: Family Medicine

## 2020-08-01 ENCOUNTER — Encounter: Payer: Self-pay | Admitting: Nurse Practitioner

## 2020-08-01 ENCOUNTER — Other Ambulatory Visit: Payer: Self-pay

## 2020-08-01 ENCOUNTER — Ambulatory Visit (INDEPENDENT_AMBULATORY_CARE_PROVIDER_SITE_OTHER): Payer: PRIVATE HEALTH INSURANCE | Admitting: Nurse Practitioner

## 2020-08-01 VITALS — BP 124/82 | HR 66 | Temp 97.7°F | Resp 18 | Ht 67.0 in | Wt 207.0 lb

## 2020-08-01 DIAGNOSIS — R300 Dysuria: Secondary | ICD-10-CM

## 2020-08-01 DIAGNOSIS — N946 Dysmenorrhea, unspecified: Secondary | ICD-10-CM

## 2020-08-01 LAB — POCT URINALYSIS DIPSTICK
Glucose, UA: NEGATIVE
Ketones, UA: NEGATIVE
Leukocytes, UA: NEGATIVE
Nitrite, UA: NEGATIVE
Protein, UA: POSITIVE — AB
Spec Grav, UA: >=1.03 — AB (ref 1.010–1.025)
Urobilinogen, UA: NEGATIVE E.U./dL — AB
pH, UA: 5.5 (ref 5.0–8.0)

## 2020-08-01 NOTE — Assessment & Plan Note (Addendum)
-  U/A negative for leuks and nitrites; positive for blood d/t MENSTRUATION -specific gravity is elevated, and we discussed drinking more water until her urine is pale yellow or clear

## 2020-08-01 NOTE — Assessment & Plan Note (Addendum)
-  she states that she does not usually have bad cramps with menstruation, but she has had cramps x 4 days -if cramps are still occurring 2 weeks from now, may consider exam and/or GYN eval

## 2020-08-01 NOTE — Progress Notes (Signed)
Acute Office Visit  Subjective:    Patient ID: Kendra Wong, female    DOB: 04-05-1976, 45 y.o.   MRN: [id number]  Chief Complaint  Patient presents with  . Dysuria    Lower abd cramping x 4 days. Burning with urination x 1 day ago.     HPI Patient is in today for dysuria and abdominal cramping.  She is currently menstruating and has been cramping for several days.  She feels like her bladder is staying full after she urinates.  Past Medical History:  Diagnosis Date  . Hypertension   . Obesity     Past Surgical History:  Procedure Laterality Date  . TONSILLECTOMY    . TYMPANOSTOMY TUBE PLACEMENT      Family History  Problem Relation Age of Onset  . Diabetes Father     Social History   Socioeconomic History  . Marital status: Married    Spouse name: Not on file  . Number of children: 2  . Years of education: Not on file  . Highest education level: Not on file  Occupational History  . Occupation: unemployed   Tobacco Use  . Smoking status: Never Smoker  . Smokeless tobacco: Never Used  Substance and Sexual Activity  . Alcohol use: Yes    Comment: occasionally  . Drug use: No  . Sexual activity: Not on file  Other Topics Concern  . Not on file  Social History Narrative   Non smoker   Social Determinants of Health   Financial Resource Strain: Not on file  Food Insecurity: Not on file  Transportation Needs: Not on file  Physical Activity: Not on file  Stress: Not on file  Social Connections: Not on file  Intimate Partner Violence: Not on file    No outpatient medications prior to visit.   No facility-administered medications prior to visit.    Allergies  Allergen Reactions  . Sulfa Antibiotics     Rash, swelling     Review of Systems  Constitutional: Negative.   Genitourinary: Positive for dysuria and menstrual problem.       Cramping with menstruation       Objective:    Physical Exam  BP 124/82   Pulse 66   Temp 97.7 F (36.5  C)   Resp 18   Ht 5\' 7"  (1.702 m)   Wt 207 lb (93.9 kg)   SpO2 98%   BMI 32.42 kg/m  Wt Readings from Last 3 Encounters:  08/01/20 207 lb (93.9 kg)  12/21/19 196 lb (88.9 kg)  06/07/19 170 lb (77.1 kg)    There are no preventive care reminders to display for this patient.  There are no preventive care reminders to display for this patient.   Lab Results  Component Value Date   TSH 1.03 07/01/2019   Lab Results  Component Value Date   WBC 3.5 (L) 07/01/2019   HGB 12.1 07/01/2019   HCT 37.9 07/01/2019   MCV 83.3 07/01/2019   PLT 212 07/01/2019   Lab Results  Component Value Date   NA 138 07/01/2019   K 4.2 07/01/2019   CO2 29 07/01/2019   GLUCOSE 90 07/01/2019   BUN 13 07/01/2019   CREATININE 0.73 07/01/2019   BILITOT 0.6 07/01/2019   ALKPHOS 50 06/22/2018   AST 18 07/01/2019   ALT 13 07/01/2019   PROT 6.6 07/01/2019   ALBUMIN 4.0 06/22/2018   CALCIUM 9.2 07/01/2019   ANIONGAP 8 06/22/2018   Lab Results  Component Value Date   CHOL 178 07/01/2019   Lab Results  Component Value Date   HDL 61 07/01/2019   Lab Results  Component Value Date   LDLCALC 103 (H) 07/01/2019   Lab Results  Component Value Date   TRIG 46 07/01/2019   Lab Results  Component Value Date   CHOLHDL 2.9 07/01/2019   Lab Results  Component Value Date   HGBA1C 5.4 08/16/2015       Assessment & Plan:   Problem List Items Addressed This Visit      Other   Dysuria - Primary    -U/A negative for leuks and nitrites; positive for blood d/t MENSTRUATION -specific gravity is elevated, and we discussed drinking more water until her urine is pale yellow or clear      Relevant Orders   POCT Urinalysis Dipstick   Urine Culture   Menstrual cramps    -she states that she does not usually have bad cramps with menstruation, but she has had cramps x 4 days -if cramps are still occurring 2 weeks from now, may consider exam and/or GYN eval          No orders of the defined  types were placed in this encounter.  Date:  08/01/2020   Location of Patient: Home Location of Provider: Office Consent was obtain for visit to be over via telehealth. I verified that I am speaking with the correct person using two identifiers.  I connected with  Kendra N Cobey on 08/01/20 via telephone and verified that I am speaking with the correct person using two identifiers.   I discussed the limitations of evaluation and management by telemedicine. The patient expressed understanding and agreed to proceed.  Time spent: 9 minutes  Urine sample collected in-office.   Noreene Larsson, NP

## 2020-08-04 LAB — URINE CULTURE

## 2020-08-04 NOTE — Progress Notes (Signed)
Her specific gravity was elevated, and that generally means dehydration. Try increasing water intake until urine is clear or pale yellow. The urine culture came back negative, so no need to add or change medicines.

## 2020-09-06 ENCOUNTER — Encounter: Payer: Self-pay | Admitting: Family Medicine

## 2020-09-11 ENCOUNTER — Encounter: Payer: Self-pay | Admitting: Internal Medicine

## 2020-09-11 ENCOUNTER — Telehealth: Payer: Self-pay

## 2020-09-11 ENCOUNTER — Other Ambulatory Visit: Payer: Self-pay

## 2020-09-11 ENCOUNTER — Ambulatory Visit (INDEPENDENT_AMBULATORY_CARE_PROVIDER_SITE_OTHER): Payer: PRIVATE HEALTH INSURANCE | Admitting: Internal Medicine

## 2020-09-11 VITALS — BP 178/96 | HR 76 | Temp 98.8°F | Resp 18 | Ht 67.0 in | Wt 214.1 lb

## 2020-09-11 DIAGNOSIS — K625 Hemorrhage of anus and rectum: Secondary | ICD-10-CM

## 2020-09-11 DIAGNOSIS — L237 Allergic contact dermatitis due to plants, except food: Secondary | ICD-10-CM | POA: Diagnosis not present

## 2020-09-11 MED ORDER — CETIRIZINE HCL 10 MG PO TABS: 10.0000 mg | ORAL_TABLET | Freq: Every day | ORAL | 5 refills | Status: DC

## 2020-09-11 MED ORDER — CLOBETASOL PROPIONATE 0.05 % EX CREA: 1.0000 "application " | TOPICAL_CREAM | Freq: Two times a day (BID) | CUTANEOUS | 0 refills | Status: DC

## 2020-09-11 NOTE — Telephone Encounter (Signed)
NO TO THE COVID SHOT

## 2020-09-11 NOTE — Patient Instructions (Addendum)
Rectal Bleeding  Rectal bleeding is when blood comes out of the opening of the butt (anus). People with this kind of bleeding may notice bright red blood in their underwear or in the toilet after they poop (have a bowel movement). They may also have blood mixed with their poop (stool), or dark red or black poop. Rectal bleeding is often a sign that something is wrong. This condition can be caused by many things. It needs to be checked by a doctor. Your doctor will do tests to know what is causing your condition. Follow these instructions at home: Watch for any changes in your condition. Take these actions to help with bleeding and discomfort: Medicines  Take over-the-counter and prescription medicines only as told by your doctor.  Ask your doctor about changing or stopping your normal medicines. This is important if you are taking blood thinners. Medicines that thin the blood can make rectal bleeding worse. Managing constipation Your condition may cause trouble pooping (constipation). To prevent or treat trouble pooping, or to help make your poop soft, you may need to:  Drink enough fluid to keep your pee (urine) pale yellow.  Take over-the-counter or prescription medicines.  Eat foods that are high in fiber. These include beans, whole grains, and fresh fruits and vegetables.  Limit foods that are high in fat and sugar. These include fried or sweet foods.   General instructions  Try not to strain when you poop.  Try taking a warm bath. This may help with pain.  Keep all follow-up visits as told by your doctor. This is important. Contact a doctor if:  You have pain or swelling in your belly (abdomen).  You have a fever.  You feel weak.  You feel like you may vomit.  You cannot poop. Get help right away if:  You have new bleeding.  You have more bleeding than before.  You have black or dark red poop.  You vomit blood or something that looks like coffee grounds.  You  pass out (faint).  You have very bad pain in your butt. Summary  Rectal bleeding is when blood comes out of the opening of the butt. This bleeding is often a sign that something is wrong.  Eat a diet that is high in fiber. This will help to keep your poop soft.  Talk to your doctor if you take medicines that thin the blood. These medicines can make bleeding worse.  Get help right away if you have new or more bleeding, black or dark red poop, or blood in your vomit. Also, get help if you pass out or have very bad pain in your butt. This information is not intended to replace advice given to you by your health care provider. Make sure you discuss any questions you have with your health care provider. Document Revised: 03/17/2019 Document Reviewed: 03/17/2019 Elsevier Patient Education  2021 [place].    Please take Zyrtec as needed for allergic reaction. Apply Clobetasol cream locally for rash.  Avoid allergen exposure if possible.   Poison Ivy Dermatitis Poison ivy dermatitis is redness and soreness of the skin caused by chemicals in the leaves of the poison ivy plant. You may have very bad itching, swelling, a rash, and blisters. What are the causes?  Touching a poison ivy plant.  Touching something that has the chemical on it. This may include animals or objects that have come in contact with the plant. What increases the risk?  Going outdoors often in  wooded or [place] areas.  Going outdoors without wearing protective clothing, such as closed shoes, long pants, and a long-sleeved shirt. What are the signs or symptoms?  Skin redness.  Very bad itching.  A rash that often includes bumps and blisters. ? The rash usually appears 48 hours after exposure, if you have been exposed before. ? If this is the first time you have been exposed, the rash may not appear until a week after exposure.  Swelling. This may occur if the reaction is very bad. Symptoms usually last for  1-2 weeks. The first time you develop this condition, symptoms may last 3-4 weeks.   How is this treated? This condition may be treated with:  Hydrocortisone cream or calamine lotion to relieve itching.  Oatmeal baths to soothe the skin.  Medicines, such as over-the-counter antihistamine tablets.  Oral steroid medicine for more severe reactions. Follow these instructions at home: Medicines  Take or apply over-the-counter and prescription medicines only as told by your doctor.  Use hydrocortisone cream or calamine lotion as needed to help with itching. General instructions  Do not scratch or rub your skin.  Put a cold, wet cloth (cold compress) on the affected areas or take baths in cool water. This will help with itching.  Avoid hot baths and showers.  Take oatmeal baths as needed. Use colloidal oatmeal. You can get this at a pharmacy or grocery store. Follow the instructions on the package.  While you have the rash, wash your clothes right after you wear them.  Keep all follow-up visits as told by your health care provider. This is important. How is this prevented?  Know what poison ivy looks like, so you can avoid it. ? This plant has three leaves with flowering branches on a single stem. ? The leaves are glossy. ? The leaves have uneven edges that come to a point at the front.  If you touch poison ivy, wash your skin with soap and water right away. Be sure to wash under your fingernails.  When hiking or camping, wear long pants, a long-sleeved shirt, tall socks, and hiking boots. You can also use a lotion on your skin that helps to prevent contact with poison ivy.  If you think that your clothes or outdoor gear came in contact with poison ivy, rinse them off with a garden hose before you bring them inside your house.  When doing yard work or gardening, wear gloves, long sleeves, long pants, and boots. Wash your garden tools and gloves if they come in contact with poison  ivy.  If you think that your pet has come into contact with poison ivy, wash him or her with pet shampoo and water. Make sure to wear gloves while washing your pet.   Contact a doctor if:  You have open sores in the rash area.  You have more redness, swelling, or pain in the rash area.  You have redness that spreads beyond the rash area.  You have fluid, blood, or pus coming from the rash area.  You have a fever.  You have a rash over a large area of your body.  You have a rash on your eyes, mouth, or genitals.  Your rash does not get better after a few weeks. Get help right away if:  Your face swells or your eyes swell shut.  You have trouble breathing.  You have trouble swallowing. These symptoms may be an emergency. Do not wait to see if the symptoms will  go away. Get medical help right away. Call your local emergency services (911 in the [place] Do not drive yourself to the hospital. Summary  Poison ivy dermatitis is redness and soreness of the skin caused by chemicals in the leaves of the poison ivy plant.  You may have skin redness, very bad itching, swelling, and a rash.  Do not scratch or rub your skin.  Take or apply over-the-counter and prescription medicines only as told by your doctor. This information is not intended to replace advice given to you by your health care provider. Make sure you discuss any questions you have with your health care provider. Document Revised: 08/07/2018 Document Reviewed: 04/10/2018 Elsevier Patient Education  2021 [employer].

## 2020-09-11 NOTE — Progress Notes (Addendum)
Acute Office Visit  Subjective:    Patient ID: Kendra Wong, female    DOB: 1975-11-28, 45 y.o.   MRN: [id number]  Chief Complaint  Patient presents with  . Rectal Bleeding    Last Monday looked like mucous and tissue and on day 2 and 3 after there was blood in stool has pictures if need to see also has gotten in [place] and is broke out really bad     HPI Patient is in today for evaluation of bright red blood in stool for 2 days in the last week, last episode 5 days ago. She had mucus in stool one time in last week as well. She denies any similar events in the past. Denies any melena, constipation, nausea, vomiting or heartburn. Denies any rectal pain. She has brought pictures of rectal bleeding.  She also reports rash over her right breast area, left elbow and abdominal area. She did gardening yesterday and since then, she had worse rash over her left elbow. She reports having poison ivy dermatitis in the past as well. She has been using calamine lotion and Tecnu wash.  Past Medical History:  Diagnosis Date  . Hypertension   . Obesity     Past Surgical History:  Procedure Laterality Date  . TONSILLECTOMY    . TYMPANOSTOMY TUBE PLACEMENT      Family History  Problem Relation Age of Onset  . Diabetes Father     Social History   Socioeconomic History  . Marital status: Married    Spouse name: Not on file  . Number of children: 2  . Years of education: Not on file  . Highest education level: Not on file  Occupational History  . Occupation: unemployed   Tobacco Use  . Smoking status: Never Smoker  . Smokeless tobacco: Never Used  Substance and Sexual Activity  . Alcohol use: Yes    Comment: occasionally  . Drug use: No  . Sexual activity: Not on file  Other Topics Concern  . Not on file  Social History Narrative   Non smoker   Social Determinants of Health   Financial Resource Strain: Not on file  Food Insecurity: Not on file  Transportation Needs:  Not on file  Physical Activity: Not on file  Stress: Not on file  Social Connections: Not on file  Intimate Partner Violence: Not on file    No outpatient medications prior to visit.   No facility-administered medications prior to visit.    Allergies  Allergen Reactions  . Sulfa Antibiotics     Rash, swelling     Review of Systems  Constitutional: Negative for chills and fever.  HENT: Negative for congestion, sinus pressure, sinus pain and sore throat.   Eyes: Negative for pain and discharge.  Respiratory: Negative for cough and shortness of breath.   Cardiovascular: Negative for chest pain and palpitations.  Gastrointestinal: Positive for blood in stool. Negative for abdominal pain, constipation, diarrhea, nausea and vomiting.  Endocrine: Negative for polydipsia and polyuria.  Genitourinary: Negative for dysuria and hematuria.  Musculoskeletal: Negative for neck pain and neck stiffness.  Skin: Positive for rash.  Neurological: Negative for dizziness and weakness.  Psychiatric/Behavioral: Negative for agitation and behavioral problems.       Objective:    Physical Exam Vitals reviewed.  Constitutional:      General: She is not in acute distress.    Appearance: She is not diaphoretic.  HENT:     Head: Normocephalic and  atraumatic.     Nose: Nose normal.     Mouth/Throat:     Mouth: Mucous membranes are moist.  Eyes:     General: No scleral icterus.    Extraocular Movements: Extraocular movements intact.  Cardiovascular:     Rate and Rhythm: Normal rate and regular rhythm.     Pulses: Normal pulses.     Heart sounds: Normal heart sounds. No murmur heard.   Pulmonary:     Breath sounds: Normal breath sounds. No wheezing or rales.  Abdominal:     Palpations: Abdomen is soft.     Tenderness: There is no abdominal tenderness. There is no guarding or rebound.  Musculoskeletal:     Cervical back: Neck supple. No tenderness.     Right lower leg: No edema.      Left lower leg: No edema.  Skin:    General: Skin is warm.     Findings: Rash (Vesicles over erythematous base over left elbow, eczematous rash over left breast area and LLQ abdominal wall) present.  Neurological:     General: No focal deficit present.     Mental Status: She is alert and oriented to person, place, and time.  Psychiatric:        Mood and Affect: Mood normal.        Behavior: Behavior normal.     BP (!) 178/96 (BP Location: Right Arm, Patient Position: Sitting, Cuff Size: Normal)   Pulse 76   Temp 98.8 F (37.1 C) (Oral)   Resp 18   Ht 5\' 7"  (1.702 m)   Wt 214 lb 1.9 oz (97.1 kg)   SpO2 99%   BMI 33.54 kg/m  Wt Readings from Last 3 Encounters:  09/11/20 214 lb 1.9 oz (97.1 kg)  08/01/20 207 lb (93.9 kg)  12/21/19 196 lb (88.9 kg)    Health Maintenance Due  Topic Date Due  . COVID-19 Vaccine (1) Never done    There are no preventive care reminders to display for this patient.   Lab Results  Component Value Date   TSH 1.03 07/01/2019   Lab Results  Component Value Date   WBC 3.5 (L) 07/01/2019   HGB 12.1 07/01/2019   HCT 37.9 07/01/2019   MCV 83.3 07/01/2019   PLT 212 07/01/2019   Lab Results  Component Value Date   NA 138 07/01/2019   K 4.2 07/01/2019   CO2 29 07/01/2019   GLUCOSE 90 07/01/2019   BUN 13 07/01/2019   CREATININE 0.73 07/01/2019   BILITOT 0.6 07/01/2019   ALKPHOS 50 06/22/2018   AST 18 07/01/2019   ALT 13 07/01/2019   PROT 6.6 07/01/2019   ALBUMIN 4.0 06/22/2018   CALCIUM 9.2 07/01/2019   ANIONGAP 8 06/22/2018   Lab Results  Component Value Date   CHOL 178 07/01/2019   Lab Results  Component Value Date   HDL 61 07/01/2019   Lab Results  Component Value Date   LDLCALC 103 (H) 07/01/2019   Lab Results  Component Value Date   TRIG 46 07/01/2019   Lab Results  Component Value Date   CHOLHDL 2.9 07/01/2019   Lab Results  Component Value Date   HGBA1C 5.4 08/16/2015       Assessment & Plan:   Problem  List Items Addressed This Visit   None   Visit Diagnoses    Poison ivy dermatitis    -  Primary Avoid allergen exposure iif possible Zyrtec PRN Clobetasol cream for local irritation  Relevant Medications   cetirizine (ZYRTEC) 10 MG tablet   clobetasol cream (TEMOVATE) 0.05 %    Rectal bleeding  Episodes of bright red blood per rectum Check CBC Referred to GI for urgent evaluation to r/o IBD as patient reports mucus in stool    Relevant Orders   Ambulatory referral to Gastroenterology   CBC with Differential/Platelet       Meds ordered this encounter  Medications  . cetirizine (ZYRTEC) 10 MG tablet    Sig: Take 1 tablet (10 mg total) by mouth daily.    Dispense:  30 tablet    Refill:  5  . clobetasol cream (TEMOVATE) 0.05 %    Sig: Apply 1 application topically 2 (two) times daily.    Dispense:  30 g    Refill:  0     Gemini Bunte Keith Rake, MD

## 2020-09-11 NOTE — Telephone Encounter (Signed)
Ok I asked her but we dont close it out unless she had done

## 2020-09-11 NOTE — H&P (View-Only) (Signed)
Referring Provider: Lindell Spar, MD Primary Care Physician:  Fayrene Helper, MD Primary Gastroenterologist:  Dr. Abbey Chatters  Chief Complaint  Patient presents with  . Rectal Bleeding    3 days last week    HPI:   Kendra Wong is a 45 y.o. female presenting today at the request of Lindell Spar, MD for rectal bleeding.  Patient had office visit with PCP 09/11/2020.  She reported 2 episodes of bright red blood in her stool within the last week, last occurrence 5 days ago.  No similar events in the past.  CBC was ordered and referral to GI was placed.  Today she reports new onset rectal bleeding last week x 3 days. Last occurrence was 1 week ago today. 1st day with mucous and what looked like a clot of bloody tissue. Last day with loose stool and blood mixed in. No associated abdominal pain. No rectal pain.  No history of hemorrhoids.  Typically with 1-2 loose BMs daily. Rare constipation if going on vacation. Occasional lower abdominal pain. No particular trigger. Once a week. More like a cramp.   Denies nausea, vomiting, acid reflux/heartburn, and dysphagia.  Notes increased urinary frequency x 1 year. Intermittent burning. History of pre-diabetes. Had weight loss a couple years ago, but has been gaining weight recently.    No family history of IBD.  Maternal grandmother and her sisters had colon cancer.   No prior colonoscopy.    Past Medical History:  Diagnosis Date  . Hypertension   . Obesity     Past Surgical History:  Procedure Laterality Date  . TONSILLECTOMY    . TYMPANOSTOMY TUBE PLACEMENT      Current Outpatient Medications  Medication Sig Dispense Refill  . cetirizine (ZYRTEC) 10 MG tablet Take 1 tablet (10 mg total) by mouth daily. 30 tablet 5  . clobetasol cream (TEMOVATE) 2.45 % Apply 1 application topically 2 (two) times daily. 30 g 0  . CLENPIQ 10-3.5-12 MG-GM -GM/160ML SOLN Take 1 kit by mouth once for 1 dose. 320 mL 0   No current  facility-administered medications for this visit.    Allergies as of 09/13/2020 - Review Complete 09/13/2020  Allergen Reaction Noted  . Sulfa antibiotics  09/23/2014    Family History  Problem Relation Age of Onset  . Diabetes Father   . Colon cancer Maternal Grandmother        early 36s  . Colon cancer Other   . Inflammatory bowel disease Neg Hx     Social History   Socioeconomic History  . Marital status: Married    Spouse name: Not on file  . Number of children: 2  . Years of education: Not on file  . Highest education level: Not on file  Occupational History  . Occupation: unemployed   Tobacco Use  . Smoking status: Never Smoker  . Smokeless tobacco: Never Used  Substance and Sexual Activity  . Alcohol use: Yes    Comment: 2-3 mixed drinks on the weekend.   . Drug use: No  . Sexual activity: Not on file  Other Topics Concern  . Not on file  Social History Narrative   Non smoker   Social Determinants of Health   Financial Resource Strain: Not on file  Food Insecurity: Not on file  Transportation Needs: Not on file  Physical Activity: Not on file  Stress: Not on file  Social Connections: Not on file  Intimate Partner Violence: Not on file  Review of Systems: Gen: Denies any fever, chills, cold or flulike symptoms, lightheadedness, dizziness, presyncope, syncope. CV: Denies chest pain or heart palpitations. Resp: Denies shortness of breath or cough. GI: See HPI GU : See HPI MS: Denies joint pain Derm: Denies rash Psych: Denies depression or anxiety Heme: See HPI  Physical Exam: BP (!) 138/93   Pulse 69   Temp (!) 96.9 F (36.1 C) (Temporal)   Ht '5\' 7"'  (1.702 m)   Wt 211 lb 12.8 oz (96.1 kg)   LMP 08/24/2020 (Approximate)   BMI 33.17 kg/m  General:   Alert and oriented. Pleasant and cooperative. Well-nourished and well-developed.  Head:  Normocephalic and atraumatic. Eyes:  Without icterus, sclera clear and conjunctiva pink.  Ears:  Normal  auditory acuity. Lungs:  Clear to auscultation bilaterally. No wheezes, rales, or rhonchi. No distress.  Heart:  S1, S2 present without murmurs appreciated.  Abdomen:  +BS, soft, and non-distended.  Minimal TTP across lower abdomen.  No HSM noted. No guarding or rebound. No masses appreciated.  Rectal:  Deferred  Msk:  Symmetrical without gross deformities. Normal posture. Extremities:  Without edema. Neurologic:  Alert and  oriented x4;  grossly normal neurologically. Skin:  Intact without significant lesions or rashes. Psych:Normal mood and affect.   Assessment: 45 year old female presenting today at request of primary care provider for new onset rectal bleeding.  She had 3 occurrences of rectal bleeding 1 week ago with no associated abdominal pain or rectal pain at that time. 1 year history of 1-2 loose BMs daily.  Rare constipation. History of mild intermittent lower abdominal cramping without identified trigger.  No other significant GI symptoms.  Family history significant for maternal grandmother and maternal great aunts with history of colon cancer.  Patient has never had colonoscopy. CBC completed 5/16 with hemoglobin 12.8.  Differentials for rectal bleeding include hemorrhoids, IBD, colon polyps, or malignancy.  Less likely diverticular bleed or colonic AVMs.  She will need colonoscopy for further evaluation.  Plan: 1.  Proceed with colonoscopy with propofol with Dr. Abbey Chatters in the near future. The risks, benefits, and alternatives have been discussed with the patient in detail. The patient states understanding and desires to proceed.  ASA II  2.  Follow-up after procedure.  Advised to call if any recurrent rectal bleeding prior to her procedure.    Aliene Altes, PA-C [facility] 09/13/2020

## 2020-09-11 NOTE — Progress Notes (Signed)
Referring Provider: Lindell Spar, MD Primary Care Physician:  Fayrene Helper, MD Primary Gastroenterologist:  Dr. Abbey Chatters  Chief Complaint  Patient presents with  . Rectal Bleeding    3 days last week    HPI:   Kendra Wong is a 45 y.o. female presenting today at the request of Lindell Spar, MD for rectal bleeding.  Patient had office visit with PCP 09/11/2020.  She reported 2 episodes of bright red blood in her stool within the last week, last occurrence 5 days ago.  No similar events in the past.  CBC was ordered and referral to GI was placed.  Today she reports new onset rectal bleeding last week x 3 days. Last occurrence was 1 week ago today. 1st day with mucous and what looked like a clot of bloody tissue. Last day with loose stool and blood mixed in. No associated abdominal pain. No rectal pain.  No history of hemorrhoids.  Typically with 1-2 loose BMs daily. Rare constipation if going on vacation. Occasional lower abdominal pain. No particular trigger. Once a week. More like a cramp.   Denies nausea, vomiting, acid reflux/heartburn, and dysphagia.  Notes increased urinary frequency x 1 year. Intermittent burning. History of pre-diabetes. Had weight loss a couple years ago, but has been gaining weight recently.    No family history of IBD.  Maternal grandmother and her sisters had colon cancer.   No prior colonoscopy.    Past Medical History:  Diagnosis Date  . Hypertension   . Obesity     Past Surgical History:  Procedure Laterality Date  . TONSILLECTOMY    . TYMPANOSTOMY TUBE PLACEMENT      Current Outpatient Medications  Medication Sig Dispense Refill  . cetirizine (ZYRTEC) 10 MG tablet Take 1 tablet (10 mg total) by mouth daily. 30 tablet 5  . clobetasol cream (TEMOVATE) 6.23 % Apply 1 application topically 2 (two) times daily. 30 g 0  . CLENPIQ 10-3.5-12 MG-GM -GM/160ML SOLN Take 1 kit by mouth once for 1 dose. 320 mL 0   No current  facility-administered medications for this visit.    Allergies as of 09/13/2020 - Review Complete 09/13/2020  Allergen Reaction Noted  . Sulfa antibiotics  09/23/2014    Family History  Problem Relation Age of Onset  . Diabetes Father   . Colon cancer Maternal Grandmother        early 71s  . Colon cancer Other   . Inflammatory bowel disease Neg Hx     Social History   Socioeconomic History  . Marital status: Married    Spouse name: Not on file  . Number of children: 2  . Years of education: Not on file  . Highest education level: Not on file  Occupational History  . Occupation: unemployed   Tobacco Use  . Smoking status: Never Smoker  . Smokeless tobacco: Never Used  Substance and Sexual Activity  . Alcohol use: Yes    Comment: 2-3 mixed drinks on the weekend.   . Drug use: No  . Sexual activity: Not on file  Other Topics Concern  . Not on file  Social History Narrative   Non smoker   Social Determinants of Health   Financial Resource Strain: Not on file  Food Insecurity: Not on file  Transportation Needs: Not on file  Physical Activity: Not on file  Stress: Not on file  Social Connections: Not on file  Intimate Partner Violence: Not on file  Review of Systems: Gen: Denies any fever, chills, cold or flulike symptoms, lightheadedness, dizziness, presyncope, syncope. CV: Denies chest pain or heart palpitations. Resp: Denies shortness of breath or cough. GI: See HPI GU : See HPI MS: Denies joint pain Derm: Denies rash Psych: Denies depression or anxiety Heme: See HPI  Physical Exam: BP (!) 138/93   Pulse 69   Temp (!) 96.9 F (36.1 C) (Temporal)   Ht '5\' 7"'  (1.702 m)   Wt 211 lb 12.8 oz (96.1 kg)   LMP 08/24/2020 (Approximate)   BMI 33.17 kg/m  General:   Alert and oriented. Pleasant and cooperative. Well-nourished and well-developed.  Head:  Normocephalic and atraumatic. Eyes:  Without icterus, sclera clear and conjunctiva pink.  Ears:  Normal  auditory acuity. Lungs:  Clear to auscultation bilaterally. No wheezes, rales, or rhonchi. No distress.  Heart:  S1, S2 present without murmurs appreciated.  Abdomen:  +BS, soft, and non-distended.  Minimal TTP across lower abdomen.  No HSM noted. No guarding or rebound. No masses appreciated.  Rectal:  Deferred  Msk:  Symmetrical without gross deformities. Normal posture. Extremities:  Without edema. Neurologic:  Alert and  oriented x4;  grossly normal neurologically. Skin:  Intact without significant lesions or rashes. Psych:Normal mood and affect.   Assessment: 45 year old female presenting today at request of primary care provider for new onset rectal bleeding.  She had 3 occurrences of rectal bleeding 1 week ago with no associated abdominal pain or rectal pain at that time. 1 year history of 1-2 loose BMs daily.  Rare constipation. History of mild intermittent lower abdominal cramping without identified trigger.  No other significant GI symptoms.  Family history significant for maternal grandmother and maternal great aunts with history of colon cancer.  Patient has never had colonoscopy. CBC completed 5/16 with hemoglobin 12.8.  Differentials for rectal bleeding include hemorrhoids, IBD, colon polyps, or malignancy.  Less likely diverticular bleed or colonic AVMs.  She will need colonoscopy for further evaluation.  Plan: 1.  Proceed with colonoscopy with propofol with Dr. Abbey Chatters in the near future. The risks, benefits, and alternatives have been discussed with the patient in detail. The patient states understanding and desires to proceed.  ASA II  2.  Follow-up after procedure.  Advised to call if any recurrent rectal bleeding prior to her procedure.    Aliene Altes, PA-C [facility] 09/13/2020

## 2020-09-12 LAB — CBC WITH DIFFERENTIAL/PLATELET
Basophils Absolute: 0 10*3/uL (ref 0.0–0.2)
Basos: 1 %
EOS (ABSOLUTE): 0.1 10*3/uL (ref 0.0–0.4)
Eos: 1 %
Hematocrit: 39.5 % (ref 34.0–46.6)
Hemoglobin: 12.8 g/dL (ref 11.1–15.9)
Immature Grans (Abs): 0 10*3/uL (ref 0.0–0.1)
Immature Granulocytes: 1 %
Lymphocytes Absolute: 1.7 10*3/uL (ref 0.7–3.1)
Lymphs: 35 %
MCH: 26.6 pg (ref 26.6–33.0)
MCHC: 32.4 g/dL (ref 31.5–35.7)
MCV: 82 fL (ref 79–97)
Monocytes Absolute: 0.6 10*3/uL (ref 0.1–0.9)
Monocytes: 13 %
Neutrophils Absolute: 2.4 10*3/uL (ref 1.4–7.0)
Neutrophils: 49 %
Platelets: 240 10*3/uL (ref 150–450)
RBC: 4.82 x10E6/uL (ref 3.77–5.28)
RDW: 13.3 % (ref 11.7–15.4)
WBC: 4.8 10*3/uL (ref 3.4–10.8)

## 2020-09-13 ENCOUNTER — Telehealth: Payer: Self-pay | Admitting: *Deleted

## 2020-09-13 ENCOUNTER — Ambulatory Visit (INDEPENDENT_AMBULATORY_CARE_PROVIDER_SITE_OTHER): Payer: PRIVATE HEALTH INSURANCE | Admitting: Gastroenterology

## 2020-09-13 ENCOUNTER — Encounter: Payer: Self-pay | Admitting: *Deleted

## 2020-09-13 ENCOUNTER — Encounter: Payer: Self-pay | Admitting: Gastroenterology

## 2020-09-13 ENCOUNTER — Other Ambulatory Visit: Payer: Self-pay | Admitting: *Deleted

## 2020-09-13 VITALS — BP 138/93 | HR 69 | Temp 96.9°F | Ht 67.0 in | Wt 211.8 lb

## 2020-09-13 DIAGNOSIS — K625 Hemorrhage of anus and rectum: Secondary | ICD-10-CM | POA: Insufficient documentation

## 2020-09-13 MED ORDER — CLENPIQ 10-3.5-12 MG-GM -GM/160ML PO SOLN: 1.0000 | Freq: Once | ORAL | 0 refills | Status: AC

## 2020-09-13 NOTE — Telephone Encounter (Signed)
Called [employer] and Spoke with [employer]. Was advised pt plan does not require PA for TCS.

## 2020-09-13 NOTE — Patient Instructions (Addendum)
We will arrange for you to have a colonoscopy in the near future with Dr. Abbey Chatters.  Monitor for recurrent rectal bleeding and let me know if this occurs.  We will plan to follow-up with you in the office after your procedure.    Aliene Altes, PA-C [facility]

## 2020-09-13 NOTE — Progress Notes (Signed)
Cc'ed to pcp °

## 2020-09-15 ENCOUNTER — Other Ambulatory Visit (HOSPITAL_COMMUNITY)
Admission: RE | Admit: 2020-09-15 | Discharge: 2020-09-15 | Disposition: A | Discharge status: Home / Self Care | Payer: PRIVATE HEALTH INSURANCE | Source: Ambulatory Visit | Attending: Internal Medicine | Admitting: Internal Medicine

## 2020-09-15 ENCOUNTER — Other Ambulatory Visit: Payer: Self-pay

## 2020-09-15 DIAGNOSIS — Z01812 Encounter for preprocedural laboratory examination: Secondary | ICD-10-CM | POA: Insufficient documentation

## 2020-09-15 DIAGNOSIS — Z20822 Contact with and (suspected) exposure to covid-19: Secondary | ICD-10-CM | POA: Diagnosis not present

## 2020-09-16 LAB — SARS CORONAVIRUS 2 (TAT 6-24 HRS): SARS Coronavirus 2: NEGATIVE

## 2020-09-18 ENCOUNTER — Ambulatory Visit (HOSPITAL_COMMUNITY)
Admission: RE | Admit: 2020-09-18 | Discharge: 2020-09-18 | Disposition: A | Discharge status: Home / Self Care | Payer: PRIVATE HEALTH INSURANCE | Source: Ambulatory Visit | Attending: Internal Medicine | Admitting: Internal Medicine

## 2020-09-18 ENCOUNTER — Encounter (HOSPITAL_COMMUNITY): Payer: Self-pay

## 2020-09-18 ENCOUNTER — Ambulatory Visit (HOSPITAL_COMMUNITY): Payer: PRIVATE HEALTH INSURANCE | Admitting: Anesthesiology

## 2020-09-18 ENCOUNTER — Other Ambulatory Visit: Payer: Self-pay

## 2020-09-18 ENCOUNTER — Encounter (HOSPITAL_COMMUNITY)
Admission: RE | Disposition: A | Discharge status: Home / Self Care | Payer: Self-pay | Source: Ambulatory Visit | Attending: Internal Medicine

## 2020-09-18 DIAGNOSIS — K625 Hemorrhage of anus and rectum: Secondary | ICD-10-CM | POA: Diagnosis not present

## 2020-09-18 DIAGNOSIS — D123 Benign neoplasm of transverse colon: Secondary | ICD-10-CM | POA: Insufficient documentation

## 2020-09-18 DIAGNOSIS — Z79899 Other long term (current) drug therapy: Secondary | ICD-10-CM | POA: Insufficient documentation

## 2020-09-18 DIAGNOSIS — Z800 Family history of malignant neoplasm of digestive organs: Secondary | ICD-10-CM | POA: Insufficient documentation

## 2020-09-18 DIAGNOSIS — R7303 Prediabetes: Secondary | ICD-10-CM | POA: Diagnosis not present

## 2020-09-18 DIAGNOSIS — Z01812 Encounter for preprocedural laboratory examination: Secondary | ICD-10-CM | POA: Diagnosis not present

## 2020-09-18 DIAGNOSIS — Z882 Allergy status to sulfonamides status: Secondary | ICD-10-CM | POA: Diagnosis not present

## 2020-09-18 DIAGNOSIS — K648 Other hemorrhoids: Secondary | ICD-10-CM | POA: Insufficient documentation

## 2020-09-18 DIAGNOSIS — K635 Polyp of colon: Secondary | ICD-10-CM

## 2020-09-18 HISTORY — PX: COLONOSCOPY WITH PROPOFOL: SHX5780

## 2020-09-18 HISTORY — PX: POLYPECTOMY: SHX5525

## 2020-09-18 LAB — PREGNANCY, URINE: Preg Test, Ur: NEGATIVE

## 2020-09-18 SURGERY — COLONOSCOPY WITH PROPOFOL
Anesthesia: General

## 2020-09-18 MED ORDER — PROPOFOL 500 MG/50ML IV EMUL
INTRAVENOUS | Status: DC | PRN
  Administered 2020-09-18: 150 ug/kg/min via INTRAVENOUS

## 2020-09-18 MED ORDER — LACTATED RINGERS IV SOLN: INTRAVENOUS | Status: DC

## 2020-09-18 MED ORDER — PROPOFOL 10 MG/ML IV BOLUS
INTRAVENOUS | Status: DC | PRN
  Administered 2020-09-18: 70 mg via INTRAVENOUS
  Administered 2020-09-18: 20 mg via INTRAVENOUS
  Administered 2020-09-18: 50 mg via INTRAVENOUS

## 2020-09-18 NOTE — Interval H&P Note (Signed)
History and Physical Interval Note:  09/18/2020 9:52 AM  Kendra Wong  has presented today for surgery, with the diagnosis of rectal bleeding.  The various methods of treatment have been discussed with the patient and family. After consideration of risks, benefits and other options for treatment, the patient has consented to  Procedure(s) with comments: COLONOSCOPY WITH PROPOFOL (N/A) - 10:00am as a surgical intervention.  The patient's history has been reviewed, patient examined, no change in status, stable for surgery.  I have reviewed the patient's chart and labs.  Questions were answered to the patient's satisfaction.     Eloise Harman

## 2020-09-18 NOTE — Anesthesia Preprocedure Evaluation (Addendum)
Anesthesia Evaluation  Patient identified by MRN, date of birth, ID band Patient awake    Reviewed: Allergy & Precautions, NPO status , Patient's Chart, lab work & pertinent test results  History of Anesthesia Complications Negative for: history of anesthetic complications  Airway Mallampati: II  TM Distance: >3 FB Neck ROM: Full    Dental  (+) Dental Advisory Given, Chipped,    Pulmonary neg pulmonary ROS,    Pulmonary exam normal breath sounds clear to auscultation       Cardiovascular Exercise Tolerance: Good hypertension, Normal cardiovascular exam Rhythm:Regular Rate:Normal     Neuro/Psych PSYCHIATRIC DISORDERS Depression    GI/Hepatic GERD (tums prn, mild GERD)  Controlled,(+)     substance abuse  alcohol use,   Endo/Other  negative endocrine ROS  Renal/GU negative Renal ROS     Musculoskeletal  (+) Arthritis  (Back pain),   Abdominal   Peds  Hematology negative hematology ROS (+)   Anesthesia Other Findings   Reproductive/Obstetrics negative OB ROS                           Anesthesia Physical Anesthesia Plan  ASA: II  Anesthesia Plan: General   Post-op Pain Management:    Induction:   PONV Risk Score and Plan: TIVA  Airway Management Planned: Nasal Cannula and Natural Airway  Additional Equipment:   Intra-op Plan:   Post-operative Plan:   Informed Consent: I have reviewed the patients History and Physical, chart, labs and discussed the procedure including the risks, benefits and alternatives for the proposed anesthesia with the patient or authorized representative who has indicated his/her understanding and acceptance.     Dental advisory given  Plan Discussed with: CRNA and Surgeon  Anesthesia Plan Comments:         Anesthesia Quick Evaluation

## 2020-09-18 NOTE — Op Note (Signed)
[facility] Patient Name: Kendra Wong Procedure Date: 09/18/2020 10:12 AM MRN: [id number] Date of Birth: 1975-10-24 Attending MD: Elon Alas. Abbey Chatters DO CSN: [id number] Age: 45 Admit Type: Outpatient Procedure:                Colonoscopy Indications:              Rectal bleeding Providers:                Elon Alas. Abbey Chatters, DO, Gwenlyn Fudge, RN, Aram Candela Referring MD:              Medicines:                See the Anesthesia note for documentation of the                            administered medications Complications:            No immediate complications. Estimated Blood Loss:     Estimated blood loss was minimal. Procedure:                Pre-Anesthesia Assessment:                           - The anesthesia plan was to use monitored                            anesthesia care (MAC).                           After obtaining informed consent, the colonoscope                            was passed under direct vision. Throughout the                            procedure, the patient's blood pressure, pulse, and                            oxygen saturations were monitored continuously. The                            PCF-[id number] ([id number]) was introduced through the                            anus and advanced to the the cecum, identified by                            appendiceal orifice and ileocecal valve. The                            colonoscopy was performed without difficulty. The                            patient tolerated the procedure well. The quality  of the bowel preparation was evaluated using the                            BBPS [facility] Bowel Preparation Scale) with scores                            of: Right Colon = 3, Transverse Colon = 3 and Left                            Colon = 3 (entire mucosa seen well with no residual                            staining, small fragments of stool or opaque                             liquid). The total BBPS score equals 9. Scope In: 10:33:57 AM Scope Out: 10:44:52 AM Scope Withdrawal Time: 0 hours 6 minutes 26 seconds  Total Procedure Duration: 0 hours 10 minutes 55 seconds  Findings:      Non-bleeding internal hemorrhoids were found during retroflexion.      A 2 mm polyp was found in the transverse colon. The polyp was sessile.       The polyp was removed with a cold biopsy forceps. Resection and       retrieval were complete.      The exam was otherwise without abnormality. Impression:               - Non-bleeding internal hemorrhoids.                           - One 2 mm polyp in the transverse colon, removed                            with a cold biopsy forceps. Resected and retrieved.                           - The examination was otherwise normal. Moderate Sedation:      Per Anesthesia Care Recommendation:           - Patient has a contact number available for                            emergencies. The signs and symptoms of potential                            delayed complications were discussed with the                            patient. Return to normal activities tomorrow.                            Written discharge instructions were provided to the                            patient.                           -  Resume previous diet.                           - Continue present medications.                           - Await pathology results.                           - Repeat colonoscopy in 7-10 years for surveillance                            depending on pathology.                           - Return to GI clinic at the next available                            appointment with Roseanne Kaufman for hemorrhoid banding                            if rectal bleeding continues. Procedure Code(s):        --- Professional ---                           [phone], Colonoscopy, flexible; with biopsy, single                            or  multiple Diagnosis Code(s):        --- Professional ---                           K63.5, Polyp of colon                           K64.8, Other hemorrhoids                           K62.5, Hemorrhage of anus and rectum CPT copyright 2019 American Medical Association. All rights reserved. The codes documented in this report are preliminary and upon coder review may  be revised to meet current compliance requirements. Elon Alas. Abbey Chatters, DO [place] Abbey Chatters, DO 09/18/2020 10:47:33 AM This report has been signed electronically. Number of Addenda: 0

## 2020-09-18 NOTE — Anesthesia Postprocedure Evaluation (Signed)
Anesthesia Post Note  Patient: Kendra Wong  Procedure(s) Performed: COLONOSCOPY WITH PROPOFOL (N/A ) POLYPECTOMY  Patient location during evaluation: Endoscopy Anesthesia Type: General Level of consciousness: awake and alert Pain management: pain level controlled Vital Signs Assessment: post-procedure vital signs reviewed and stable Respiratory status: spontaneous breathing Cardiovascular status: blood pressure returned to baseline and stable Postop Assessment: no apparent nausea or vomiting Anesthetic complications: no   No complications documented.   Last Vitals:  Vitals:   09/18/20 0904  BP: (!) 143/91  Resp: 20  Temp: 37.1 C  SpO2: 96%    Last Pain:  Vitals:   09/18/20 1027  TempSrc:   PainSc: 0-No pain                 Willena Jeancharles

## 2020-09-18 NOTE — Transfer of Care (Signed)
Immediate Anesthesia Transfer of Care Note  Patient: [place] N Gaw  Procedure(s) Performed: COLONOSCOPY WITH PROPOFOL (N/A ) POLYPECTOMY  Patient Location: Endoscopy Unit  Anesthesia Type:General  Level of Consciousness: awake  Airway & Oxygen Therapy: Patient Spontanous Breathing  Post-op Assessment: Report given to RN  Post vital signs: Reviewed and stable  Last Vitals:  Vitals Value Taken Time  BP    Temp    Pulse    Resp    SpO2      Last Pain:  Vitals:   09/18/20 1027  TempSrc:   PainSc: 0-No pain      Patients Stated Pain Goal: 8 (88/87/57 [id number])  Complications: No complications documented.

## 2020-09-18 NOTE — Discharge Instructions (Addendum)
Colonoscopy Discharge Instructions  Read the instructions outlined below and refer to this sheet in the next few weeks. These discharge instructions provide you with general information on caring for yourself after you leave the hospital. Your doctor may also give you specific instructions. While your treatment has been planned according to the most current medical practices available, unavoidable complications occasionally occur.   ACTIVITY  You may resume your regular activity, but move at a slower pace for the next 24 hours.   Take frequent rest periods for the next 24 hours.   Walking will help get rid of the air and reduce the bloated feeling in your belly (abdomen).   No driving for 24 hours (because of the medicine (anesthesia) used during the test).    Do not sign any important legal documents or operate any machinery for 24 hours (because of the anesthesia used during the test).  NUTRITION  Drink plenty of fluids.   You may resume your normal diet as instructed by your doctor.   Begin with a light meal and progress to your normal diet. Heavy or fried foods are harder to digest and may make you feel sick to your stomach (nauseated).   Avoid alcoholic beverages for 24 hours or as instructed.  MEDICATIONS  You may resume your normal medications unless your doctor tells you otherwise.  WHAT YOU CAN EXPECT TODAY  Some feelings of bloating in the abdomen.   Passage of more gas than usual.   Spotting of blood in your stool or on the toilet paper.  IF YOU HAD POLYPS REMOVED DURING THE COLONOSCOPY:  No aspirin products for 7 days or as instructed.   No alcohol for 7 days or as instructed.   Eat a soft diet for the next 24 hours.  FINDING OUT THE RESULTS OF YOUR TEST Not all test results are available during your visit. If your test results are not back during the visit, make an appointment with your caregiver to find out the results. Do not assume everything is normal if  you have not heard from your caregiver or the medical facility. It is important for you to follow up on all of your test results.  SEEK IMMEDIATE MEDICAL ATTENTION IF:  You have more than a spotting of blood in your stool.   Your belly is swollen (abdominal distention).   You are nauseated or vomiting.   You have a temperature over 101.   You have abdominal pain or discomfort that is severe or gets worse throughout the day.   Your colonoscopy revealed 1 small polyp which I removed successfully.  You do have internal hemorrhoids which is likely the cause of your bleeding.  If this continues to be an issue we can set you up for hemorrhoid banding in our clinic.  Please call the office and schedule an appointment with Roseanne Kaufman to have this done if you would like in the future.  We will tentatively plan on repeat colonoscopy in 7 to 10 years depending on the pathology of the polyp removed.  Otherwise follow-up with GI as needed.  I hope you have a great rest of your week!  Elon Alas. Abbey Chatters, D.O. Gastroenterology and Hepatology [facility]   Colon Polyps  Colon polyps are tissue growths inside the colon, which is part of the large intestine. They are one of the types of polyps that can grow in the body. A polyp may be a round bump or a mushroom-shaped growth. You could  have one polyp or more than one. Most colon polyps are noncancerous (benign). However, some colon polyps can become cancerous over time. Finding and removing the polyps early can help prevent this. What are the causes? The exact cause of colon polyps is not known. What increases the risk? The following factors may make you more likely to develop this condition:  Having a family history of colorectal cancer or colon polyps.  Being older than 45 years of age.  Being younger than 45 years of age and having a significant family history of colorectal cancer or colon polyps or a genetic condition  that puts you at higher risk of getting colon polyps.  Having inflammatory bowel disease, such as ulcerative colitis or Crohn's disease.  Having certain conditions passed from parent to child (hereditary conditions), such as: ? Familial adenomatous polyposis (FAP). ? Lynch syndrome. ? Turcot syndrome. ? Peutz-Jeghers syndrome. ? MUTYH-associated polyposis (MAP).  Being overweight.  Certain lifestyle factors. These include smoking cigarettes, drinking too much alcohol, not getting enough exercise, and eating a diet that is high in fat and red meat and low in fiber.  Having had childhood cancer that was treated with radiation of the abdomen. What are the signs or symptoms? Many times, there are no symptoms. If you have symptoms, they may include:  Blood coming from the rectum during a bowel movement.  Blood in the stool (feces). The blood may be bright red or very dark in color.  Pain in the abdomen.  A change in bowel habits, such as constipation or diarrhea. How is this diagnosed? This condition is diagnosed with a colonoscopy. This is a procedure in which a lighted, flexible scope is inserted into the opening between the buttocks (anus) and then passed into the colon to examine the area. Polyps are sometimes found when a colonoscopy is done as part of routine cancer screening tests. How is this treated? This condition is treated by removing any polyps that are found. Most polyps can be removed during a colonoscopy. Those polyps will then be tested for cancer. Additional treatment may be needed depending on the results of testing. Follow these instructions at home: Eating and drinking  Eat foods that are high in fiber, such as fruits, vegetables, and whole grains.  Eat foods that are high in calcium and vitamin D, such as milk, cheese, yogurt, eggs, liver, fish, and broccoli.  Limit foods that are high in fat, such as fried foods and desserts.  Limit the amount of red meat,  precooked or cured meat, or other processed meat that you eat, such as hot dogs, sausages, bacon, or meat loaves.  Limit sugary drinks.   Lifestyle  Maintain a healthy weight, or lose weight if recommended by your health care provider.  Exercise every day or as told by your health care provider.  Do not use any products that contain nicotine or tobacco, such as cigarettes, e-cigarettes, and chewing tobacco. If you need help quitting, ask your health care provider.  Do not drink alcohol if: ? Your health care provider tells you not to drink. ? You are pregnant, may be pregnant, or are planning to become pregnant.  If you drink alcohol: ? Limit how much you use to:  0-1 drink a day for women.  0-2 drinks a day for men. ? Know how much alcohol is in your drink. In the [place] one drink equals one 12 oz bottle of beer (355 mL), one 5 oz glass of wine (148 mL),  or one 1 oz glass of hard liquor (44 mL). General instructions  Take over-the-counter and prescription medicines only as told by your health care provider.  Keep all follow-up visits. This is important. This includes having regularly scheduled colonoscopies. Talk to your health care provider about when you need a colonoscopy. Contact a health care provider if:  You have new or worsening bleeding during a bowel movement.  You have new or increased blood in your stool.  You have a change in bowel habits.  You lose weight for no known reason. Summary  Colon polyps are tissue growths inside the colon, which is part of the large intestine. They are one type of polyp that can grow in the body.  Most colon polyps are noncancerous (benign), but some can become cancerous over time.  This condition is diagnosed with a colonoscopy.  This condition is treated by removing any polyps that are found. Most polyps can be removed during a colonoscopy. This information is not intended to replace advice given to you by your health care  provider. Make sure you discuss any questions you have with your health care provider. Document Revised: 08/04/2019 Document Reviewed: 08/04/2019 Elsevier Patient Education  2021 [place].  Hemorrhoids Hemorrhoids are swollen veins that may develop:  In the butt (rectum). These are called internal hemorrhoids.  Around the opening of the butt (anus). These are called external hemorrhoids. Hemorrhoids can cause pain, itching, or bleeding. Most of the time, they do not cause serious problems. They usually get better with diet changes, lifestyle changes, and other home treatments. What are the causes? This condition may be caused by:  Having trouble pooping (constipation).  Pushing hard (straining) to poop.  Watery poop (diarrhea).  Pregnancy.  Being very overweight (obese).  Sitting for long periods of time.  Heavy lifting or other activity that causes you to strain.  Anal sex.  Riding a bike for a long period of time. What are the signs or symptoms? Symptoms of this condition include:  Pain.  Itching or soreness in the butt.  Bleeding from the butt.  Leaking poop.  Swelling in the area.  One or more lumps around the opening of your butt. How is this diagnosed? A doctor can often diagnose this condition by looking at the affected area. The doctor may also:  Do an exam that involves feeling the area with a gloved hand (digital rectal exam).  Examine the area inside your butt using a small tube (anoscope).  Order blood tests. This may be done if you have lost a lot of blood.  Have you get a test that involves looking inside the colon using a flexible tube with a camera on the end (sigmoidoscopy or colonoscopy). How is this treated? This condition can usually be treated at home. Your doctor may tell you to change what you eat, make lifestyle changes, or try home treatments. If these do not help, procedures can be done to remove the hemorrhoids or make them  smaller. These may involve:  Placing rubber bands at the base of the hemorrhoids to cut off their blood supply.  Injecting medicine into the hemorrhoids to shrink them.  Shining a type of light energy onto the hemorrhoids to cause them to fall off.  Doing surgery to remove the hemorrhoids or cut off their blood supply. Follow these instructions at home: Eating and drinking  Eat foods that have a lot of fiber in them. These include whole grains, beans, nuts, fruits, and vegetables.  Ask your doctor about taking products that have added fiber (fibersupplements).  Reduce the amount of fat in your diet. You can do this by: ? Eating low-fat dairy products. ? Eating less red meat. ? Avoiding processed foods.  Drink enough fluid to keep your pee (urine) pale yellow.   Managing pain and swelling  Take a warm-water bath (sitz bath) for 20 minutes to ease pain. Do this 3-4 times a day. You may do this in a bathtub or using a portable sitz bath that fits over the toilet.  If told, put ice on the painful area. It may be helpful to use ice between your warm baths. ? Put ice in a plastic bag. ? Place a towel between your skin and the bag. ? Leave the ice on for 20 minutes, 2-3 times a day.   General instructions  Take over-the-counter and prescription medicines only as told by your doctor. ? Medicated creams and medicines may be used as told.  Exercise often. Ask your doctor how much and what kind of exercise is best for you.  Go to the bathroom when you have the urge to poop. Do not wait.  Avoid pushing too hard when you poop.  Keep your butt dry and clean. Use wet toilet paper or moist towelettes after pooping.  Do not sit on the toilet for a long time.  Keep all follow-up visits as told by your doctor. This is important. Contact a doctor if you:  Have pain and swelling that do not get better with treatment or medicine.  Have trouble pooping.  Cannot poop.  Have pain or  swelling outside the area of the hemorrhoids. Get help right away if you have:  Bleeding that will not stop. Summary  Hemorrhoids are swollen veins in the butt or around the opening of the butt.  They can cause pain, itching, or bleeding.  Eat foods that have a lot of fiber in them. These include whole grains, beans, nuts, fruits, and vegetables.  Take a warm-water bath (sitz bath) for 20 minutes to ease pain. Do this 3-4 times a day. This information is not intended to replace advice given to you by your health care provider. Make sure you discuss any questions you have with your health care provider. Document Revised: 04/23/2018 Document Reviewed: 09/04/2017 Elsevier Patient Education  [place].

## 2020-09-19 LAB — SURGICAL PATHOLOGY

## 2020-09-26 ENCOUNTER — Encounter (HOSPITAL_COMMUNITY): Payer: Self-pay | Admitting: Internal Medicine

## 2020-11-14 ENCOUNTER — Encounter: Payer: Self-pay | Admitting: Family Medicine

## 2020-11-14 ENCOUNTER — Ambulatory Visit (INDEPENDENT_AMBULATORY_CARE_PROVIDER_SITE_OTHER): Payer: No Typology Code available for payment source | Admitting: Family Medicine

## 2020-11-14 ENCOUNTER — Other Ambulatory Visit: Payer: Self-pay

## 2020-11-14 VITALS — BP 149/95 | HR 75 | Resp 16 | Ht 67.0 in | Wt 215.0 lb

## 2020-11-14 DIAGNOSIS — D709 Neutropenia, unspecified: Secondary | ICD-10-CM

## 2020-11-14 DIAGNOSIS — R030 Elevated blood-pressure reading, without diagnosis of hypertension: Secondary | ICD-10-CM

## 2020-11-14 DIAGNOSIS — D72819 Decreased white blood cell count, unspecified: Secondary | ICD-10-CM

## 2020-11-14 DIAGNOSIS — R0683 Snoring: Secondary | ICD-10-CM

## 2020-11-14 DIAGNOSIS — G4733 Obstructive sleep apnea (adult) (pediatric): Secondary | ICD-10-CM

## 2020-11-14 DIAGNOSIS — E785 Hyperlipidemia, unspecified: Secondary | ICD-10-CM | POA: Diagnosis not present

## 2020-11-14 DIAGNOSIS — Z1231 Encounter for screening mammogram for malignant neoplasm of breast: Secondary | ICD-10-CM

## 2020-11-14 DIAGNOSIS — E663 Overweight: Secondary | ICD-10-CM

## 2020-11-14 DIAGNOSIS — E669 Obesity, unspecified: Secondary | ICD-10-CM

## 2020-11-14 DIAGNOSIS — E559 Vitamin D deficiency, unspecified: Secondary | ICD-10-CM

## 2020-11-14 NOTE — Assessment & Plan Note (Signed)
  Patient re-educated about  the importance of commitment to a  minimum of 150 minutes of exercise per week as able.  The importance of healthy food choices with portion control discussed, as well as eating regularly and within a 12 hour window most days. The need to choose "clean , green" food 50 to 75% of the time is discussed, as well as to make water the primary drink and set a goal of 64 ounces water daily.    Weight /BMI 11/14/2020 09/18/2020 09/13/2020  WEIGHT 215 lb 211 lb 211 lb 12.8 oz  HEIGHT 5\' 7"  5\' 7"  5\' 7"   BMI 33.67 kg/m2 33.05 kg/m2 33.17 kg/m2

## 2020-11-14 NOTE — Assessment & Plan Note (Signed)
Hyperlipidemia:Low fat diet discussed and encouraged.   Lipid Panel  Lab Results  Component Value Date   CHOL 178 07/01/2019   HDL 61 07/01/2019   LDLCALC 103 (H) 07/01/2019   TRIG 46 07/01/2019   CHOLHDL 2.9 07/01/2019     Updated lab needed at/ before next visit.

## 2020-11-14 NOTE — Assessment & Plan Note (Signed)
DASH diet and commitment to daily physical activity for a minimum of 30 minutes discussed and encouraged, as a part of hypertension management. The importance of attaining a healthy weight is also discussed.  BP/Weight 11/14/2020 09/18/2020 09/13/2020 09/11/2020 08/01/2020 7/82/4235 07/02/1441  Systolic BP 154 008 676 195 093 267 124  Diastolic BP 95 66 93 96 82 93 78  Wt. (Lbs) 215 211 211.8 214.12 207 196 170  BMI 33.67 33.05 33.17 33.54 32.42 30.7 26.63

## 2020-11-14 NOTE — Progress Notes (Signed)
Kendra Wong     MRN: [id number]      DOB: 12-27-1975   HPI [name] is here for follow up and re-evaluation of chronic medical conditions, medication management and review of any available recent lab and radiology data.  Preventive health is updated, specifically  Cancer screening and Immunization.   Questions or concerns regarding consultations or procedures which the PT has had in the interim are  addressed. The PT denies any adverse reactions to current medications since the last visit.  C/o excess fatigue and snoring  C/o weight gain, less exercise and not as restricted eating, wants to change this ROS Denies recent fever or chills. Denies sinus pressure, nasal congestion, ear pain or sore throat. Denies chest congestion, productive cough or wheezing. Denies chest pains, palpitations and leg swelling Denies abdominal pain, nausea, vomiting,diarrhea or constipation.   Denies dysuria, frequency, hesitancy or incontinence. Denies joint pain, swelling and limitation in mobility. Denies headaches, seizures, numbness, or tingling. Denies depression, anxiety or insomnia. Denies skin break down or rash.   PE  BP (!) 149/95   Pulse 75   Resp 16   Ht 5\' 7"  (1.702 m)   Wt 215 lb (97.5 kg)   SpO2 95%   BMI 33.67 kg/m   Patient alert and oriented and in no cardiopulmonary distress.  HEENT: No facial asymmetry, EOMI,     Neck supple .  Chest: Clear to auscultation bilaterally.  CVS: S1, S2 no murmurs, no S3.Regular rate.  .   Ext: No edema  MS: Adequate ROM spine, shoulders, hips and knees.  Skin: Intact, no ulcerations or rash noted.  Psych: Good eye contact, normal affect. Memory intact not anxious or depressed appearing.  CNS: CN 2-12 intact, power,  normal throughout.no focal deficits noted.   Assessment & Plan   Elevated blood pressure reading in office without diagnosis of hypertension DASH diet and commitment to daily physical activity for a minimum of  30 minutes discussed and encouraged, as a part of hypertension management. The importance of attaining a healthy weight is also discussed.  BP/Weight 11/14/2020 09/18/2020 09/13/2020 09/11/2020 08/01/2020 11/21/3662 4/0/3474  Systolic BP 259 563 875 643 329 518 841  Diastolic BP 95 66 93 96 82 93 78  Wt. (Lbs) 215 211 211.8 214.12 207 196 170  BMI 33.67 33.05 33.17 33.54 32.42 30.7 26.63       Hyperlipidemia LDL goal <100 Hyperlipidemia:Low fat diet discussed and encouraged.   Lipid Panel  Lab Results  Component Value Date   CHOL 178 07/01/2019   HDL 61 07/01/2019   LDLCALC 103 (H) 07/01/2019   TRIG 46 07/01/2019   CHOLHDL 2.9 07/01/2019     Updated lab needed at/ before next visit.   Obesity (BMI 30.0-34.9)  Patient re-educated about  the importance of commitment to a  minimum of 150 minutes of exercise per week as able.  The importance of healthy food choices with portion control discussed, as well as eating regularly and within a 12 hour window most days. The need to choose "clean , green" food 50 to 75% of the time is discussed, as well as to make water the primary drink and set a goal of 64 ounces water daily.    Weight /BMI 11/14/2020 09/18/2020 09/13/2020  WEIGHT 215 lb 211 lb 211 lb 12.8 oz  HEIGHT 5\' 7"  5\' 7"  5\' 7"   BMI 33.67 kg/m2 33.05 kg/m2 33.17 kg/m2      Snoring Reports excess snoring and fatigue  refer for sleep apnea

## 2020-11-14 NOTE — Assessment & Plan Note (Addendum)
Reports excess snoring and fatigue refer for sleep apnea

## 2020-11-14 NOTE — Patient Instructions (Addendum)
Annual exam in 4 months, re eval BP and weight, call iof you need me sooner  Please schedule mammogram at checkout  You are referred for eval for sleep apnea  It is important that you exercise regularly at least 30 minutes 5 times a week. If you develop chest pain, have severe difficulty breathing, or feel very tired, stop exercising immediately and seek medical attention   Weight loss goal  of 10 pounds  Fasting labs CBC, lipid, cmp and EGFr, TSH, Vit D and HBA1C this week  Reconsider covid vaccine  Thanks for choosing [facility], we consider it a privelige to serve you.

## 2020-11-22 ENCOUNTER — Ambulatory Visit (HOSPITAL_COMMUNITY): Payer: No Typology Code available for payment source

## 2020-11-23 ENCOUNTER — Encounter (HOSPITAL_COMMUNITY): Payer: Self-pay | Admitting: Emergency Medicine

## 2020-11-23 ENCOUNTER — Emergency Department (HOSPITAL_COMMUNITY): Payer: No Typology Code available for payment source

## 2020-11-23 ENCOUNTER — Other Ambulatory Visit: Payer: Self-pay

## 2020-11-23 ENCOUNTER — Emergency Department (HOSPITAL_COMMUNITY)
Admission: EM | Admit: 2020-11-23 | Discharge: 2020-11-23 | Disposition: A | Discharge status: Home / Self Care | Payer: No Typology Code available for payment source | Attending: Emergency Medicine | Admitting: Emergency Medicine

## 2020-11-23 DIAGNOSIS — S20364A Insect bite (nonvenomous) of middle front wall of thorax, initial encounter: Secondary | ICD-10-CM | POA: Diagnosis not present

## 2020-11-23 DIAGNOSIS — W57XXXA Bitten or stung by nonvenomous insect and other nonvenomous arthropods, initial encounter: Secondary | ICD-10-CM | POA: Diagnosis not present

## 2020-11-23 DIAGNOSIS — I10 Essential (primary) hypertension: Secondary | ICD-10-CM | POA: Insufficient documentation

## 2020-11-23 DIAGNOSIS — T782XXA Anaphylactic shock, unspecified, initial encounter: Secondary | ICD-10-CM

## 2020-11-23 LAB — COMPREHENSIVE METABOLIC PANEL
ALT: 16 U/L (ref 0–44)
AST: 22 U/L (ref 15–41)
Albumin: 3.8 g/dL (ref 3.5–5.0)
Alkaline Phosphatase: 51 U/L (ref 38–126)
Anion gap: 9 (ref 5–15)
BUN: 15 mg/dL (ref 6–20)
CO2: 22 mmol/L (ref 22–32)
Calcium: 8.3 mg/dL — ABNORMAL LOW (ref 8.9–10.3)
Chloride: 103 mmol/L (ref 98–111)
Creatinine, Ser: 0.89 mg/dL (ref 0.44–1.00)
GFR, Estimated: >60 mL/min (ref >60)
Glucose, Bld: 131 mg/dL — ABNORMAL HIGH (ref 70–99)
Potassium: 3.8 mmol/L (ref 3.5–5.1)
Sodium: 134 mmol/L — ABNORMAL LOW (ref 135–145)
Total Bilirubin: 0.4 mg/dL (ref 0.3–1.2)
Total Protein: 6.6 g/dL (ref 6.5–8.1)

## 2020-11-23 LAB — CBG MONITORING, ED: Glucose-Capillary: 140 mg/dL — ABNORMAL HIGH (ref 70–99)

## 2020-11-23 LAB — CBC WITH DIFFERENTIAL/PLATELET
Abs Immature Granulocytes: 0 10*3/uL (ref 0.00–0.07)
Basophils Absolute: 0 10*3/uL (ref 0.0–0.1)
Basophils Relative: 0 %
Eosinophils Absolute: 0 10*3/uL (ref 0.0–0.5)
Eosinophils Relative: 0 %
HCT: 37.2 % (ref 36.0–46.0)
Hemoglobin: 12.1 g/dL (ref 12.0–15.0)
Immature Granulocytes: 0 %
Lymphocytes Relative: 75 %
Lymphs Abs: 3.3 10*3/uL (ref 0.7–4.0)
MCH: 26.9 pg (ref 26.0–34.0)
MCHC: 32.5 g/dL (ref 30.0–36.0)
MCV: 82.7 fL (ref 80.0–100.0)
Monocytes Absolute: 0.3 10*3/uL (ref 0.1–1.0)
Monocytes Relative: 7 %
Neutro Abs: 0.8 10*3/uL — ABNORMAL LOW (ref 1.7–7.7)
Neutrophils Relative %: 18 %
Platelets: 230 10*3/uL (ref 150–400)
RBC: 4.5 MIL/uL (ref 3.87–5.11)
RDW: 15.5 % (ref 11.5–15.5)
WBC: 4.4 10*3/uL (ref 4.0–10.5)
nRBC: 0 % (ref 0.0–0.2)

## 2020-11-23 LAB — RAPID URINE DRUG SCREEN, HOSP PERFORMED
Amphetamines: NOT DETECTED
Barbiturates: NOT DETECTED
Benzodiazepines: NOT DETECTED
Cocaine: NOT DETECTED
Opiates: NOT DETECTED
Tetrahydrocannabinol: NOT DETECTED

## 2020-11-23 LAB — HCG, QUANTITATIVE, PREGNANCY: hCG, Beta Chain, Quant, S: <1 m[IU]/mL (ref <5)

## 2020-11-23 LAB — TROPONIN I (HIGH SENSITIVITY)
Troponin I (High Sensitivity): 3 ng/L (ref <18)
Troponin I (High Sensitivity): 16 ng/L (ref <18)

## 2020-11-23 LAB — ETHANOL: Alcohol, Ethyl (B): <10 mg/dL (ref <10)

## 2020-11-23 LAB — LIPASE, BLOOD: Lipase: 38 U/L (ref 11–51)

## 2020-11-23 MED ORDER — SODIUM CHLORIDE 0.9 % IV BOLUS
1000.0000 mL | Freq: Once | INTRAVENOUS | Status: AC
  Administered 2020-11-23: 1000 mL via INTRAVENOUS

## 2020-11-23 MED ORDER — METHYLPREDNISOLONE SODIUM SUCC 125 MG IJ SOLR
125.0000 mg | Freq: Once | INTRAMUSCULAR | Status: AC
  Administered 2020-11-23: 125 mg via INTRAVENOUS
  Filled 2020-11-23: qty 2

## 2020-11-23 MED ORDER — FAMOTIDINE IN NACL 20-0.9 MG/50ML-% IV SOLN
20.0000 mg | Freq: Once | INTRAVENOUS | Status: AC
  Administered 2020-11-23: 20 mg via INTRAVENOUS
  Filled 2020-11-23: qty 50

## 2020-11-23 MED ORDER — EPINEPHRINE 0.3 MG/0.3ML IJ SOAJ: 0.3000 mg | INTRAMUSCULAR | 1 refills | Status: DC | PRN

## 2020-11-23 MED ORDER — PREDNISONE 10 MG PO TABS: 20.0000 mg | ORAL_TABLET | Freq: Every day | ORAL | 0 refills | Status: AC

## 2020-11-23 MED ORDER — EPINEPHRINE 0.3 MG/0.3ML IJ SOAJ
INTRAMUSCULAR | Status: AC
  Filled 2020-11-23: qty 0.3

## 2020-11-23 NOTE — ED Triage Notes (Signed)
Pt brought to ED by husband after getting stung by bee and became diaphoretic and had syncopal episode

## 2020-11-23 NOTE — ED Provider Notes (Signed)
Emergency Department Provider Note   I have reviewed the triage vital signs and the nursing notes.   HISTORY  Chief Complaint Insect Bite (Bee sting)   HPI Kendra Wong is a 45 y.o. female presents to the ED by private vehicle unresponsive. Husband arrives to bedside and tells me they were riding in the car with the windows down when an insect flew in the car and stung her in the chest. She became less responsive and he immediately drove to the ED. No prior history of anaphylaxis.   Level 5 caveat: Unresponsive.    Past Medical History:  Diagnosis Date   Hypertension    Obesity     Patient Active Problem List   Diagnosis Date Noted   Rectal bleeding 09/13/2020   Menstrual cramps 08/01/2020   Snoring 12/21/2019   Vulvovaginitis 12/06/2018   Rhinitis 12/06/2018   Chronic left hip pain 10/27/2017   Elevated blood pressure reading in office without diagnosis of hypertension 10/27/2017   Back pain 10/23/2017   Obesity (BMI 30.0-34.9) 09/12/2014   Leukopenia 07/11/2013   Seasonal allergies 09/07/2012   Hyperlipidemia LDL goal <100 04/16/2010    Past Surgical History:  Procedure Laterality Date   COLONOSCOPY WITH PROPOFOL N/A 09/18/2020   Procedure: COLONOSCOPY WITH PROPOFOL;  Surgeon: Eloise Harman, DO;  [facility];  Service: Endoscopy;  Laterality: N/A;  10:00am   POLYPECTOMY  09/18/2020   Procedure: POLYPECTOMY;  Surgeon: Eloise Harman, DO;  Location: [facility];  Service: Endoscopy;;   TONSILLECTOMY     TYMPANOSTOMY TUBE PLACEMENT      Allergies Bee venom and Sulfa antibiotics  Family History  Problem Relation Age of Onset   Diabetes Father    Colon cancer Maternal Grandmother        early 28s   Colon cancer Other    Inflammatory bowel disease Neg Hx     Social History Social History   Tobacco Use   Smoking status: Never   Smokeless tobacco: Never  Substance Use Topics   Alcohol use: Yes    Comment: 2-3 mixed drinks on  the weekend.    Drug use: No    Review of Systems  Level 5 caveat: Unresponsive   ____________________________________________   PHYSICAL EXAM:  VITAL SIGNS: ED Triage Vitals [11/23/20 1855]  Enc Vitals Group     BP 100/60     Pulse Rate 82     Resp (!) 23     Temp      Temp src      SpO2 98 %   Vitals:   11/23/20 2207 11/23/20 2243  BP: 119/84 127/78  Pulse: 78 84  Resp: (!) 22 18  Temp:    SpO2: 100% 99%    Constitutional: Unresponsive but breathing spontaneously. Grimacing and opening eye to pain.  Eyes: Conjunctivae are normal. PERRL (3 mm).  Head: Atraumatic. Nose: No congestion/rhinnorhea. Mouth/Throat: Mucous membranes are moist.  Neck: No stridor.   Cardiovascular: Normal rate, regular rhythm. Good peripheral circulation. Grossly normal heart sounds.   Respiratory: Normal respiratory effort is weak.  No retractions. Lungs CTAB. Gastrointestinal: Soft and nontender. No distention.  Musculoskeletal: No lower extremity tenderness nor edema. No gross deformities of extremities. Neurologic: Opens eyes to pain and loud voice. Not able to participate in exam. Moving extremities to pain.    ____________________________________________   LABS (all labs ordered are listed, but only abnormal results are displayed)  Labs Reviewed  COMPREHENSIVE METABOLIC PANEL -  Abnormal; Notable for the following components:      Result Value   Sodium 134 (*)    Glucose, Bld 131 (*)    Calcium 8.3 (*)    All other components within normal limits  CBC WITH DIFFERENTIAL/PLATELET - Abnormal; Notable for the following components:   Neutro Abs 0.8 (*)    All other components within normal limits  CBG MONITORING, ED - Abnormal; Notable for the following components:   Glucose-Capillary 140 (*)    All other components within normal limits  RESP PANEL BY RT-PCR (FLU A&B, COVID) ARPGX2  LIPASE, BLOOD  RAPID URINE DRUG SCREEN, HOSP PERFORMED  ETHANOL  HCG, QUANTITATIVE, PREGNANCY   TROPONIN I (HIGH SENSITIVITY)  TROPONIN I (HIGH SENSITIVITY)   ____________________________________________  EKG   EKG Interpretation  Date/Time:  Thursday November 23 2020 19:02:24 EDT Ventricular Rate:  81 PR Interval:  161 QRS Duration: 102 QT Interval:  428 QTC Calculation: 497 R Axis:   78 Text Interpretation: Sinus rhythm Right atrial enlargement Borderline prolonged QT interval No acute changes No significant change since last tracing Confirmed by Varney Biles [phone]) on 11/24/2020 4:20:17 PM        ____________________________________________  RADIOLOGY  CXR reviewed and unremarkable.   ____________________________________________   PROCEDURES  Procedure(s) performed:   Procedures  CRITICAL CARE Performed by: Margette Fast Total critical care time: 35 minutes Critical care time was exclusive of separately billable procedures and treating other patients. Critical care was necessary to treat or prevent imminent or life-threatening deterioration. Critical care was time spent personally by me on the following activities: development of treatment plan with patient and/or surrogate as well as nursing, discussions with consultants, evaluation of patient's response to treatment, examination of patient, obtaining history from patient or surrogate, ordering and performing treatments and interventions, ordering and review of laboratory studies, ordering and review of radiographic studies, pulse oximetry and re-evaluation of patient's condition.  Nanda Quinton, MD Emergency Medicine  ____________________________________________   INITIAL IMPRESSION / ASSESSMENT AND PLAN / ED COURSE  Pertinent labs & imaging results that were available during my care of the patient were reviewed by me and considered in my medical decision making (see chart for details).   Patient arrives unresponsive and hypotensive. No rash or lip/tongue swelling. History provided by husband supports  insect sting causing symptoms. Patient given Epipen along with steroids and pepcid. BP improving along with mental status. Will observe in the ED. Head imaging cancelled with additional history from husband.   10:41 PM  Patient re-evaluated. Feeling much better. Awake and alert. Husband has identified the insect as a red wasp. Will discharge home with EpiPen Rx. Discussed its administration and indication for use. Vitals have remained WNL since the initial episode.    ____________________________________________  FINAL CLINICAL IMPRESSION(S) / ED DIAGNOSES  Final diagnoses:  Anaphylactic shock  Insect bite of middle front wall of thorax, initial encounter     MEDICATIONS GIVEN DURING THIS VISIT:  Medications  sodium chloride 0.9 % bolus 1,000 mL (0 mLs Intravenous Stopped 11/23/20 2026)  EPINEPHrine (EPI-PEN) 0.3 mg/0.3 mL injection (  Given 11/23/20 1902)  methylPREDNISolone sodium succinate (SOLU-MEDROL) 125 mg/2 mL injection 125 mg (125 mg Intravenous Given 11/23/20 1913)  famotidine (PEPCID) IVPB 20 mg premix (0 mg Intravenous Stopped 11/23/20 1945)     NEW OUTPATIENT MEDICATIONS STARTED DURING THIS VISIT:  Discharge Medication List as of 11/23/2020 10:44 PM     START taking these medications   Details  EPINEPHrine 0.3  mg/0.3 mL IJ SOAJ injection Inject 0.3 mg into the muscle as needed for anaphylaxis., Starting Thu 11/23/2020, Normal    predniSONE (DELTASONE) 10 MG tablet Take 2 tablets (20 mg total) by mouth daily for 3 days., Starting Fri 11/24/2020, Until Mon 11/27/2020, Normal        Note:  This document was prepared using Dragon voice recognition software and may include unintentional dictation errors.  Nanda Quinton, MD, [facility] Emergency Medicine    Naarah Borgerding, [facility], MD 11/27/20 [phone]

## 2020-11-23 NOTE — Discharge Instructions (Addendum)

## 2020-11-23 NOTE — ED Notes (Addendum)
Husband states that they were driving to [place] and a wasp or bee flew into the car and stung patient in the center of her chest. She immediately became diaphoretic with tachypnea and unresponsive so he rushed her to the ED. She was unresponsive on arrival but soon began to answer questions. EDP at bedside upon arrival to room. Pt hypotensive on arrival and placed in trendelenburg. Pt became more arousable after epi and fluids started. She is now answering questions appropriately. There were no signs of angioedema and airway remains patent. Husband at bedside.

## 2020-11-23 NOTE — ED Notes (Signed)
Pt husband is at bedside. Pt states that she is feeling better. Denies any itching, SOB swelling at this time. Vitals WNL. Will continue to monitor pt.

## 2020-12-27 ENCOUNTER — Other Ambulatory Visit: Payer: Self-pay | Admitting: Family Medicine

## 2020-12-27 LAB — CMP14+EGFR
ALT: 10 IU/L (ref 0–32)
AST: 18 IU/L (ref 0–40)
Albumin/Globulin Ratio: 1.7 (ref 1.2–2.2)
Albumin: 4.3 g/dL (ref 3.8–4.8)
Alkaline Phosphatase: 60 IU/L (ref 44–121)
BUN/Creatinine Ratio: 14 (ref 9–23)
BUN: 10 mg/dL (ref 6–24)
Bilirubin Total: 0.3 mg/dL (ref 0.0–1.2)
CO2: 25 mmol/L (ref 20–29)
Calcium: 9.2 mg/dL (ref 8.7–10.2)
Chloride: 98 mmol/L (ref 96–106)
Creatinine, Ser: 0.74 mg/dL (ref 0.57–1.00)
Globulin, Total: 2.5 g/dL (ref 1.5–4.5)
Glucose: 83 mg/dL (ref 65–99)
Potassium: 3.8 mmol/L (ref 3.5–5.2)
Sodium: 137 mmol/L (ref 134–144)
Total Protein: 6.8 g/dL (ref 6.0–8.5)
eGFR: 102 mL/min/{1.73_m2} (ref >59)

## 2020-12-27 LAB — CBC WITH DIFFERENTIAL/PLATELET
Basophils Absolute: 0.1 10*3/uL (ref 0.0–0.2)
Basos: 1 %
EOS (ABSOLUTE): 0 10*3/uL (ref 0.0–0.4)
Eos: 1 %
Hematocrit: 37.4 % (ref 34.0–46.6)
Hemoglobin: 12.1 g/dL (ref 11.1–15.9)
Immature Grans (Abs): 0 10*3/uL (ref 0.0–0.1)
Immature Granulocytes: 0 %
Lymphocytes Absolute: 1.7 10*3/uL (ref 0.7–3.1)
Lymphs: 32 %
MCH: 26.7 pg (ref 26.6–33.0)
MCHC: 32.4 g/dL (ref 31.5–35.7)
MCV: 83 fL (ref 79–97)
Monocytes Absolute: 0.5 10*3/uL (ref 0.1–0.9)
Monocytes: 10 %
Neutrophils Absolute: 2.9 10*3/uL (ref 1.4–7.0)
Neutrophils: 56 %
Platelets: 239 10*3/uL (ref 150–450)
RBC: 4.53 x10E6/uL (ref 3.77–5.28)
RDW: 14.1 % (ref 11.7–15.4)
WBC: 5.3 10*3/uL (ref 3.4–10.8)

## 2020-12-27 LAB — LIPID PANEL
Chol/HDL Ratio: 3.3 ratio (ref 0.0–4.4)
Cholesterol, Total: 199 mg/dL (ref 100–199)
HDL: 60 mg/dL (ref >39)
LDL Chol Calc (NIH): 126 mg/dL — ABNORMAL HIGH (ref 0–99)
Triglycerides: 72 mg/dL (ref 0–149)
VLDL Cholesterol Cal: 13 mg/dL (ref 5–40)

## 2020-12-27 LAB — HEMOGLOBIN A1C
Est. average glucose Bld gHb Est-mCnc: 111 mg/dL
Hgb A1c MFr Bld: 5.5 % (ref 4.8–5.6)

## 2020-12-27 LAB — VITAMIN D 25 HYDROXY (VIT D DEFICIENCY, FRACTURES): Vit D, 25-Hydroxy: 22.8 ng/mL — ABNORMAL LOW (ref 30.0–100.0)

## 2020-12-27 LAB — TSH: TSH: 2.01 u[IU]/mL (ref 0.450–4.500)

## 2020-12-27 MED ORDER — ERGOCALCIFEROL 1.25 MG (50000 UT) PO CAPS: 50000.0000 [IU] | ORAL_CAPSULE | ORAL | 1 refills | Status: DC

## 2021-01-09 ENCOUNTER — Institutional Professional Consult (permissible substitution): Payer: No Typology Code available for payment source | Admitting: Pulmonary Disease

## 2021-02-27 ENCOUNTER — Other Ambulatory Visit (HOSPITAL_COMMUNITY)
Admission: RE | Admit: 2021-02-27 | Discharge: 2021-02-27 | Disposition: A | Discharge status: Home / Self Care | Payer: No Typology Code available for payment source | Source: Ambulatory Visit | Attending: Family Medicine | Admitting: Family Medicine

## 2021-02-27 ENCOUNTER — Other Ambulatory Visit: Payer: Self-pay

## 2021-02-27 ENCOUNTER — Encounter: Payer: Self-pay | Admitting: Family Medicine

## 2021-02-27 ENCOUNTER — Ambulatory Visit (INDEPENDENT_AMBULATORY_CARE_PROVIDER_SITE_OTHER): Payer: No Typology Code available for payment source | Admitting: Family Medicine

## 2021-02-27 VITALS — BP 146/93 | HR 65 | Resp 16 | Ht 67.0 in | Wt 220.0 lb

## 2021-02-27 DIAGNOSIS — Z0000 Encounter for general adult medical examination without abnormal findings: Secondary | ICD-10-CM

## 2021-02-27 DIAGNOSIS — N760 Acute vaginitis: Secondary | ICD-10-CM | POA: Insufficient documentation

## 2021-02-27 DIAGNOSIS — Z124 Encounter for screening for malignant neoplasm of cervix: Secondary | ICD-10-CM | POA: Diagnosis not present

## 2021-02-27 DIAGNOSIS — I10 Essential (primary) hypertension: Secondary | ICD-10-CM

## 2021-02-27 MED ORDER — AMLODIPINE BESYLATE 2.5 MG PO TABS
2.5000 mg | ORAL_TABLET | Freq: Every day | ORAL | 1 refills | Status: DC
Start: 2021-02-27 — End: 2021-08-10

## 2021-02-27 NOTE — Patient Instructions (Addendum)
F/u in 3 months, re evaluate weight and blood pressure, call if you need me sooner  Please schedule past due mammogram at checkout   Blood pressure is high,please commit to daily amlodipine as prescribed  Lifestyle changes consistently for weight management and weight loss as dicussed  My condolence on your recent losses. Therapy is ALWAYS beneficial, especially when we realize as you do, that certain behaviors you want to be in charge of continue to challenge you and be in charge ofyou1  It is important that you exercise regularly at least 30 minutes 5 times a week. If you develop chest pain, have severe difficulty breathing, or feel very tired, stop exercising immediately and seek medical attention   Think about what you will eat, plan ahead. Choose " clean, green, fresh or frozen" over canned, processed or packaged foods which are more sugary, salty and fatty. 70 to 75% of food eaten should be vegetables and fruit. Three meals at set times with snacks allowed between meals, but they must be fruit or vegetables. Aim to eat over a 12 hour period , example 7 am to 7 pm, and STOP after  your last meal of the day. Drink water,generally about 64 ounces per day, no other drink is as healthy. Fruit juice is best enjoyed in a healthy way, by EATING the fruit.  Thanks for choosing [facility], we consider it a privelige to serve you.

## 2021-02-27 NOTE — Progress Notes (Signed)
    Kendra Wong     MRN: [id number]      DOB: [birth year]  HPI: Patient is in for annual physical exam. C/o burning in her left thigh, knee and 3 toes after dancing all night in high heels, 1 week ago, discomfort is decreasing. Requests STD testing BP uncontrolled, does not take prescribed medication, in denial, re educated again of increased risk of kidney failure and heart disease and stroke  Immunization is reviewed  wants no vaccines   PE: BP (!) 146/93   Pulse 65   Resp 16   Ht 5\' 7"  (1.702 m)   Wt 220 lb (99.8 kg)   SpO2 96%   BMI 34.46 kg/m   Pleasant  female, alert and oriented x 3, in no cardio-pulmonary distress. Afebrile. HEENT No facial trauma or asymetry. Sinuses non tender.  Extra occullar muscles intact.. External ears normal, . Neck: supple, no adenopathy,JVD or thyromegaly.No bruits.  Chest: Clear to ascultation bilaterally.No crackles or wheezes. Non tender to palpation  Breast: No asymetry,no masses or lumps. No tenderness. No nipple discharge or inversion. No axillary or supraclavicular adenopathy  Cardiovascular system; Heart sounds normal,  S1 and  S2 ,no S3.  No murmur, or thrill. Apical beat not displaced Peripheral pulses normal.  Abdomen: Soft, non tender, no organomegaly or masses. No bruits. Bowel sounds normal. No guarding, tenderness or rebound.   GU: External genitalia normal female genitalia , normal female distribution of hair. No lesions. Urethral meatus normal in size, no  Prolapse, no lesions visibly  Present. Bladder non tender. Vagina pink and moist , with no visible lesions , discharge present . Adequate pelvic support no  cystocele or rectocele noted Cervix pink and appears healthy, no lesions or ulcerations noted, white  discharge noted from os Uterus normal size, no adnexal masses, no cervical motion or adnexal tenderness.   Musculoskeletal exam: Full ROM of spine, hips , shoulders and knees. No deformity  ,swelling or crepitus noted. No muscle wasting or atrophy.   Neurologic: Cranial nerves 2 to 12 intact. Power, tone ,sensation and reflexes normal throughout. No disturbance in gait. No tremor.  Skin: Intact, no ulceration, erythema , scaling or rash noted. Pigmentation normal throughout  Psych; Normal mood and affect. Judgement and concentration normal   Assessment & Plan:  Encounter for annual physical exam Annual exam as documented. Counseling done  re healthy lifestyle involving commitment to 150 minutes exercise per week, heart healthy diet, and attaining healthy weight.The importance of adequate sleep also discussed. Changes in health habits are decided on by the patient with goals and time frames  set for achieving them. Immunization and cancer screening needs are specifically addressed at this visit.   Vulvovaginitis Specimens sent for testing  Essential hypertension Re educatd re the importance of lowering blood pressure for end organ protection. Amlodipine is re prescribed, she is advised to take as directed DASH diet and commitment to daily physical activity for a minimum of 30 minutes discussed and encouraged, as a part of hypertension management. The importance of attaining a healthy weight is also discussed.  BP/Weight 02/27/2021 11/23/2020 11/14/2020 09/18/2020 09/13/2020 3/47/4259 09/01/3873  Systolic BP 643 329 518 841 660 630 160  Diastolic BP 93 78 95 66 93 96 82  Wt. (Lbs) 220 - 215 211 211.8 214.12 207  BMI 34.46 33.67 33.67 33.05 33.17 33.54 32.42

## 2021-02-28 ENCOUNTER — Encounter: Payer: Self-pay | Admitting: Family Medicine

## 2021-02-28 DIAGNOSIS — Z0000 Encounter for general adult medical examination without abnormal findings: Secondary | ICD-10-CM | POA: Insufficient documentation

## 2021-02-28 DIAGNOSIS — I10 Essential (primary) hypertension: Secondary | ICD-10-CM | POA: Insufficient documentation

## 2021-02-28 NOTE — Assessment & Plan Note (Signed)
Re educatd re the importance of lowering blood pressure for end organ protection. Amlodipine is re prescribed, she is advised to take as directed DASH diet and commitment to daily physical activity for a minimum of 30 minutes discussed and encouraged, as a part of hypertension management. The importance of attaining a healthy weight is also discussed.  BP/Weight 02/27/2021 11/23/2020 11/14/2020 09/18/2020 09/13/2020 0/48/8891 10/05/4501  Systolic BP 888 280 034 917 915 056 979  Diastolic BP 93 78 95 66 93 96 82  Wt. (Lbs) 220 - 215 211 211.8 214.12 207  BMI 34.46 33.67 33.67 33.05 33.17 33.54 32.42

## 2021-02-28 NOTE — Assessment & Plan Note (Signed)
Annual exam as documented. Counseling done  re healthy lifestyle involving commitment to 150 minutes exercise per week, heart healthy diet, and attaining healthy weight.The importance of adequate sleep also discussed. Changes in health habits are decided on by the patient with goals and time frames  set for achieving them. Immunization and cancer screening needs are specifically addressed at this visit. 

## 2021-02-28 NOTE — Assessment & Plan Note (Signed)
Specimens sent for testing

## 2021-03-01 LAB — CYTOLOGY - PAP
Comment: NEGATIVE
Diagnosis: NEGATIVE
High risk HPV: NEGATIVE

## 2021-03-01 LAB — CERVICOVAGINAL ANCILLARY ONLY
Bacterial Vaginitis (gardnerella): NEGATIVE
Candida Glabrata: NEGATIVE
Candida Vaginitis: NEGATIVE
Chlamydia: NEGATIVE
Comment: NEGATIVE
Comment: NEGATIVE
Comment: NEGATIVE
Comment: NEGATIVE
Comment: NEGATIVE
Comment: NORMAL
Neisseria Gonorrhea: NEGATIVE
Trichomonas: NEGATIVE

## 2021-03-07 ENCOUNTER — Encounter: Payer: Self-pay | Admitting: Pulmonary Disease

## 2021-03-07 ENCOUNTER — Ambulatory Visit (INDEPENDENT_AMBULATORY_CARE_PROVIDER_SITE_OTHER): Payer: No Typology Code available for payment source | Admitting: Pulmonary Disease

## 2021-03-07 ENCOUNTER — Other Ambulatory Visit: Payer: Self-pay

## 2021-03-07 VITALS — BP 132/78 | HR 83 | Temp 98.4°F | Ht 67.0 in | Wt 217.1 lb

## 2021-03-07 DIAGNOSIS — R0683 Snoring: Secondary | ICD-10-CM

## 2021-03-07 NOTE — Patient Instructions (Signed)
  Schedule HST Consider increasing sleep quantity to 6-7 hrs/ night

## 2021-03-07 NOTE — Progress Notes (Signed)
Subjective:    Patient ID: Kendra Wong, female    DOB: 08/30/1975, 45 y.o.   MRN: [id number]  HPI  45 year old with hypertension referred for evaluation of sleep disordered breathing. She reports nonrefreshing sleep and snoring that has been noted by her husband.  She feels increasingly tired on a daily basis.  She catches her self falling asleep while watching a movie or after lunch. Epworth sleepiness score is 9 and she reports sleepiness as a passenger in a car or watching TV. Bedtime is between 10-12 midnight, she is often watching TV before falling asleep, sleep latency can be about 15 minutes, she sleeps on her side with 1 pillow, reports 1 awakening for nocturia and is out of bed initially at 5 AM when the alarm goes off set by her husband, she will often get out of bed and may drink some coffee and sometimes sleep again until 6:45 AM, feels tired with dryness of mouth but denies headaches. She drinks 2 to 3 cups of caffeinated drinks daily. Weight has fluctuated between 180 and 220 pounds, currently at 217  There is no history suggestive of cataplexy, sleep paralysis or parasomnias     Past Medical History:  Diagnosis Date   Hypertension    Obesity     Past Surgical History:  Procedure Laterality Date   COLONOSCOPY WITH PROPOFOL N/A 09/18/2020   Procedure: COLONOSCOPY WITH PROPOFOL;  Surgeon: Eloise Harman, DO;  [facility];  Service: Endoscopy;  Laterality: N/A;  10:00am   POLYPECTOMY  09/18/2020   Procedure: POLYPECTOMY;  Surgeon: Eloise Harman, DO;  [facility];  Service: Endoscopy;;   TONSILLECTOMY     TYMPANOSTOMY TUBE PLACEMENT      Allergies  Allergen Reactions   Bee Venom Anaphylaxis   Sulfa Antibiotics     Rash, swelling     .soc  Family History  Problem Relation Age of Onset   Diabetes Father    Colon cancer Maternal Grandmother        early 82s   Colon cancer Other    Inflammatory bowel disease Neg Hx         Review of Systems Constitutional: negative for anorexia, fevers and sweats  Eyes: negative for irritation, redness and visual disturbance  Ears, nose, mouth, throat, and face: negative for earaches, epistaxis, nasal congestion and sore throat  Respiratory: negative for cough, dyspnea on exertion, sputum and wheezing  Cardiovascular: negative for chest pain, dyspnea, lower extremity edema, orthopnea, palpitations and syncope  Gastrointestinal: negative for abdominal pain, constipation, diarrhea, melena, nausea and vomiting  Genitourinary:negative for dysuria, frequency and hematuria  Hematologic/lymphatic: negative for bleeding, easy bruising and lymphadenopathy  Musculoskeletal:negative for arthralgias, muscle weakness and stiff joints  Neurological: negative for coordination problems, gait problems, headaches and weakness  Endocrine: negative for diabetic symptoms including polydipsia, polyuria and weight loss     Objective:   Physical Exam  Gen. Pleasant, obese, in no distress, normal affect ENT - no pallor,icterus, no post nasal drip, class 2-3 airway Neck: No JVD, no thyromegaly, no carotid bruits Lungs: no use of accessory muscles, no dullness to percussion, decreased without rales or rhonchi  Cardiovascular: Rhythm regular, heart sounds  normal, no murmurs or gallops, no peripheral edema Abdomen: soft and non-tender, no hepatosplenomegaly, BS normal. Musculoskeletal: No deformities, no cyanosis or clubbing Neuro:  alert, non focal, no tremors       Assessment & Plan:   Hypertension -appears controlled, association between OSA and  hypertension was discussed

## 2021-03-07 NOTE — Assessment & Plan Note (Addendum)
Given excessive daytime somnolence, snoring and nonrefreshing sleep , it is reasonable to investigate for obstructive sleep apnea.  We will proceed with a home sleep test.  The pathophysiology of obstructive sleep apnea , it's cardiovascular consequences & modes of treatment including CPAP were discused with the patient in detail & they evidenced understanding.  Differential diagnosis here would be decreased sleep quantity.  She seems to only get 6 hours on a daily basis.  I will asked her to increase sleep time to about 7 hours if possible.  She can try inhaled steroid such as Flonase or Nasonex 1 spray in each nare to see if this would help with snoring

## 2021-03-19 ENCOUNTER — Encounter: Payer: No Typology Code available for payment source | Admitting: Family Medicine

## 2021-05-14 ENCOUNTER — Encounter: Payer: Self-pay | Admitting: Pulmonary Disease

## 2021-06-01 ENCOUNTER — Encounter: Payer: Self-pay | Admitting: Family Medicine

## 2021-06-01 ENCOUNTER — Ambulatory Visit: Payer: PRIVATE HEALTH INSURANCE

## 2021-06-01 ENCOUNTER — Other Ambulatory Visit: Payer: Self-pay

## 2021-06-01 ENCOUNTER — Ambulatory Visit (INDEPENDENT_AMBULATORY_CARE_PROVIDER_SITE_OTHER): Payer: PRIVATE HEALTH INSURANCE | Admitting: Family Medicine

## 2021-06-01 VITALS — BP 150/92 | HR 80 | Resp 96 | Ht 67.0 in | Wt 223.0 lb

## 2021-06-01 DIAGNOSIS — E559 Vitamin D deficiency, unspecified: Secondary | ICD-10-CM

## 2021-06-01 DIAGNOSIS — E785 Hyperlipidemia, unspecified: Secondary | ICD-10-CM

## 2021-06-01 DIAGNOSIS — I83893 Varicose veins of bilateral lower extremities with other complications: Secondary | ICD-10-CM

## 2021-06-01 DIAGNOSIS — I10 Essential (primary) hypertension: Secondary | ICD-10-CM

## 2021-06-01 DIAGNOSIS — R0683 Snoring: Secondary | ICD-10-CM

## 2021-06-01 DIAGNOSIS — E669 Obesity, unspecified: Secondary | ICD-10-CM | POA: Diagnosis not present

## 2021-06-01 MED ORDER — AMLODIPINE BESYLATE 2.5 MG PO TABS: 2.5000 mg | ORAL_TABLET | Freq: Every day | ORAL | 0 refills | Status: DC

## 2021-06-01 NOTE — Patient Instructions (Signed)
F/u  in 8 to 10 weeks, call if you need me sooner  PLEASE take amlodipine 2.5 mg every day. Your blood pressure is high, normal is under 120/80.  If this remains untreated you are at risk of kidney and heart disease and stroke  Please make lifestyle changes we discussed to improve health,  you can do it!  Pls call or send message if you need a referral for therapy      Please stop caffeine to reduce heartburn and GERD symptoms  Fasting lipid, chem 7 and EGFR , HBA1C and vit D 5 days before next appointment  Thanks for choosing [facility], we consider it a privelige to serve you.

## 2021-06-03 ENCOUNTER — Encounter: Payer: Self-pay | Admitting: Family Medicine

## 2021-06-03 DIAGNOSIS — I8393 Asymptomatic varicose veins of bilateral lower extremities: Secondary | ICD-10-CM | POA: Insufficient documentation

## 2021-06-03 NOTE — Progress Notes (Signed)
Kendra Wong     MRN: [id number]      DOB: [birth year]   HPI [name] is here for follow up and re-evaluation of chronic medical conditions, medication management and review of any available recent lab and radiology data.  Preventive health is updated, specifically  Cancer screening and Immunization.   Questions or concerns regarding consultations or procedures which the PT has had in the interim are  addressed. The PT denies any adverse reactions to current medications since the last visit. She is non compliant with BP med, takes for a short time then stops.No regular exercise There are no new concerns.  There are no specific complaints   ROS Denies recent fever or chills. Denies sinus pressure, nasal congestion, ear pain or sore throat. Denies chest congestion, productive cough or wheezing. Denies chest pains, palpitations and leg swelling Denies abdominal pain, nausea, vomiting,diarrhea or constipation.   Denies dysuria, frequency, hesitancy or incontinence. Denies joint pain, swelling and limitation in mobility. Denies headaches, seizures, numbness, or tingling. Denies depression, anxiety or insomnia. Denies skin break down or rash.   PE  BP (!) 150/92    Pulse 80    Resp (!) 96    Ht 5\' 7"  (1.702 m)    Wt 223 lb 0.6 oz (101.2 kg)    SpO2 96%    BMI 34.93 kg/m   Patient alert and oriented and in no cardiopulmonary distress.  HEENT: No facial asymmetry, EOMI,     Neck supple .  Chest: Clear to auscultation bilaterally.  CVS: S1, S2 no murmurs, no S3.Regular rate.  ABD: Soft non tender.   Ext: No edema  MS: Adequate ROM spine, shoulders, hips and knees.  Skin: Intact, no ulcerations or rash noted.  Psych: Good eye contact, normal affect. Memory intact not anxious or depressed appearing.  CNS: CN 2-12 intact, power,  normal throughout.no focal deficits noted.   Assessment & Plan  Essential hypertension Uncontrolled , not taking medication regularly DASH  diet and commitment to daily physical activity for a minimum of 30 minutes discussed and encouraged, as a part of hypertension management. The importance of attaining a healthy weight is also discussed.  BP/Weight 06/01/2021 03/07/2021 02/27/2021 11/23/2020 11/14/2020 09/18/2020 7/89/3810  Systolic BP 175 102 585 277 824 235 361  Diastolic BP 92 78 93 78 95 66 93  Wt. (Lbs) 223.04 217.12 220 - 215 211 211.8  BMI 34.93 34.01 34.46 33.67 33.67 33.05 33.17       Hyperlipidemia LDL goal <100 Hyperlipidemia:Low fat diet discussed and encouraged.   Lipid Panel  Lab Results  Component Value Date   CHOL 199 12/26/2020   HDL 60 12/26/2020   LDLCALC 126 (H) 12/26/2020   TRIG 72 12/26/2020   CHOLHDL 3.3 12/26/2020     Updated lab needed at/ before next visit.   Obesity (BMI 30.0-34.9)  Patient re-educated about  the importance of commitment to a  minimum of 150 minutes of exercise per week as able.  The importance of healthy food choices with portion control discussed, as well as eating regularly and within a 12 hour window most days. The need to choose "clean , green" food 50 to 75% of the time is discussed, as well as to make water the primary drink and set a goal of 64 ounces water daily.    Weight /BMI 06/01/2021 03/07/2021 02/27/2021  WEIGHT 223 lb 0.6 oz 217 lb 1.9 oz 220 lb  HEIGHT 5\' 7"  5\' 7"  5\' 7"   BMI 34.93 kg/m2 34.01 kg/m2 34.46 kg/m2      Varicose veins of both lower extremities Bilateral leg swelling, compression hose recommended, sge declines

## 2021-06-03 NOTE — Assessment & Plan Note (Signed)
°  Patient re-educated about  the importance of commitment to a  minimum of 150 minutes of exercise per week as able.  The importance of healthy food choices with portion control discussed, as well as eating regularly and within a 12 hour window most days. The need to choose "clean , green" food 50 to 75% of the time is discussed, as well as to make water the primary drink and set a goal of 64 ounces water daily.    Weight /BMI 06/01/2021 03/07/2021 02/27/2021  WEIGHT 223 lb 0.6 oz 217 lb 1.9 oz 220 lb  HEIGHT 5\' 7"  5\' 7"  5\' 7"   BMI 34.93 kg/m2 34.01 kg/m2 34.46 kg/m2

## 2021-06-03 NOTE — Assessment & Plan Note (Signed)
Bilateral leg swelling, compression hose recommended, sge declines

## 2021-06-03 NOTE — Assessment & Plan Note (Signed)
Uncontrolled , not taking medication regularly DASH diet and commitment to daily physical activity for a minimum of 30 minutes discussed and encouraged, as a part of hypertension management. The importance of attaining a healthy weight is also discussed.  BP/Weight 06/01/2021 03/07/2021 02/27/2021 11/23/2020 11/14/2020 09/18/2020 7/61/6073  Systolic BP 710 626 948 546 270 350 093  Diastolic BP 92 78 93 78 95 66 93  Wt. (Lbs) 223.04 217.12 220 - 215 211 211.8  BMI 34.93 34.01 34.46 33.67 33.67 33.05 33.17

## 2021-06-03 NOTE — Assessment & Plan Note (Signed)
Hyperlipidemia:Low fat diet discussed and encouraged.   Lipid Panel  Lab Results  Component Value Date   CHOL 199 12/26/2020   HDL 60 12/26/2020   LDLCALC 126 (H) 12/26/2020   TRIG 72 12/26/2020   CHOLHDL 3.3 12/26/2020     Updated lab needed at/ before next visit.

## 2021-06-07 ENCOUNTER — Telehealth: Payer: Self-pay | Admitting: Pulmonary Disease

## 2021-06-07 NOTE — Telephone Encounter (Signed)
Called and spoke to patient. Gave results and she voiced understanding.

## 2021-06-07 NOTE — Telephone Encounter (Signed)
HST did not show significant OSA She should focus on increasing sleep time to at least 7h if possible

## 2021-06-08 DIAGNOSIS — R0683 Snoring: Secondary | ICD-10-CM

## 2021-07-20 ENCOUNTER — Encounter: Payer: Self-pay | Admitting: Family Medicine

## 2021-07-23 ENCOUNTER — Other Ambulatory Visit: Payer: Self-pay

## 2021-07-23 DIAGNOSIS — K625 Hemorrhage of anus and rectum: Secondary | ICD-10-CM

## 2021-07-26 LAB — HEMOGLOBIN A1C
Est. average glucose Bld gHb Est-mCnc: 114 mg/dL
Hgb A1c MFr Bld: 5.6 % (ref 4.8–5.6)

## 2021-07-26 LAB — BMP8+EGFR
BUN/Creatinine Ratio: 11 (ref 9–23)
BUN: 11 mg/dL (ref 6–24)
CO2: 26 mmol/L (ref 20–29)
Calcium: 9.2 mg/dL (ref 8.7–10.2)
Chloride: 100 mmol/L (ref 96–106)
Creatinine, Ser: 1.01 mg/dL — ABNORMAL HIGH (ref 0.57–1.00)
Glucose: 91 mg/dL (ref 70–99)
Potassium: 4.6 mmol/L (ref 3.5–5.2)
Sodium: 138 mmol/L (ref 134–144)
eGFR: 70 mL/min/{1.73_m2} (ref >59)

## 2021-07-26 LAB — LIPID PANEL
Chol/HDL Ratio: 3.4 ratio (ref 0.0–4.4)
Cholesterol, Total: 233 mg/dL — ABNORMAL HIGH (ref 100–199)
HDL: 68 mg/dL (ref >39)
LDL Chol Calc (NIH): 147 mg/dL — ABNORMAL HIGH (ref 0–99)
Triglycerides: 102 mg/dL (ref 0–149)
VLDL Cholesterol Cal: 18 mg/dL (ref 5–40)

## 2021-07-26 LAB — VITAMIN D 25 HYDROXY (VIT D DEFICIENCY, FRACTURES): Vit D, 25-Hydroxy: 13.4 ng/mL — ABNORMAL LOW (ref 30.0–100.0)

## 2021-07-27 ENCOUNTER — Ambulatory Visit (HOSPITAL_COMMUNITY)
Admission: RE | Admit: 2021-07-27 | Discharge: 2021-07-27 | Disposition: A | Discharge status: Home / Self Care | Payer: PRIVATE HEALTH INSURANCE | Source: Ambulatory Visit | Attending: Family Medicine | Admitting: Family Medicine

## 2021-07-27 DIAGNOSIS — Z1231 Encounter for screening mammogram for malignant neoplasm of breast: Secondary | ICD-10-CM | POA: Insufficient documentation

## 2021-07-28 LAB — HEAVY METALS, BLOOD
Arsenic: 2 ug/L (ref 0–9)
Lead, Blood: <1 ug/dL (ref 0.0–3.4)
Mercury: 1.4 ug/L (ref 0.0–14.9)

## 2021-08-10 ENCOUNTER — Encounter: Payer: Self-pay | Admitting: Family Medicine

## 2021-08-10 ENCOUNTER — Ambulatory Visit (INDEPENDENT_AMBULATORY_CARE_PROVIDER_SITE_OTHER): Payer: PRIVATE HEALTH INSURANCE | Admitting: Family Medicine

## 2021-08-10 VITALS — BP 142/98 | HR 79 | Resp 16 | Ht 67.0 in | Wt 227.4 lb

## 2021-08-10 DIAGNOSIS — E785 Hyperlipidemia, unspecified: Secondary | ICD-10-CM

## 2021-08-10 DIAGNOSIS — R350 Frequency of micturition: Secondary | ICD-10-CM | POA: Insufficient documentation

## 2021-08-10 DIAGNOSIS — J302 Other seasonal allergic rhinitis: Secondary | ICD-10-CM

## 2021-08-10 DIAGNOSIS — E669 Obesity, unspecified: Secondary | ICD-10-CM | POA: Diagnosis not present

## 2021-08-10 DIAGNOSIS — E66811 Obesity, class 1: Secondary | ICD-10-CM

## 2021-08-10 DIAGNOSIS — I10 Essential (primary) hypertension: Secondary | ICD-10-CM | POA: Diagnosis not present

## 2021-08-10 DIAGNOSIS — E559 Vitamin D deficiency, unspecified: Secondary | ICD-10-CM

## 2021-08-10 LAB — POCT URINALYSIS DIP (CLINITEK)
Bilirubin, UA: NEGATIVE
Blood, UA: NEGATIVE
Glucose, UA: NEGATIVE mg/dL
Ketones, POC UA: NEGATIVE mg/dL
Leukocytes, UA: NEGATIVE
Nitrite, UA: NEGATIVE
POC PROTEIN,UA: NEGATIVE
Spec Grav, UA: >=1.03 — AB (ref 1.010–1.025)
Urobilinogen, UA: 1 E.U./dL
pH, UA: 6 (ref 5.0–8.0)

## 2021-08-10 MED ORDER — ERGOCALCIFEROL 1.25 MG (50000 UT) PO CAPS: 50000.0000 [IU] | ORAL_CAPSULE | ORAL | 1 refills | Status: DC

## 2021-08-10 MED ORDER — AMLODIPINE BESYLATE 2.5 MG PO TABS: 2.5000 mg | ORAL_TABLET | Freq: Every day | ORAL | 4 refills | Status: DC

## 2021-08-10 NOTE — Assessment & Plan Note (Addendum)
2 week history, CCUAsend c/s if abn, no infectio on testing, pt reassured ?

## 2021-08-10 NOTE — Patient Instructions (Addendum)
F/u in mid August, call if you need me sooner ? ?PLEASE take once daily amlodipine 2.5 mg for blood pressure, you NEED this ? ?Urine being tested for infection since you have frequency ? ?It is important that you exercise regularly at least 30 minutes 5 times a week. If you develop chest pain, have severe difficulty breathing, or feel very tired, stop exercising immediately and seek medical attention  ? ? ? ? ?Fasting lipid, chem 7 and EGr and vit D, CBC and TSH 5 days before next appointment ? ?Thanks for choosing [facility], we consider it a privelige to serve you. ? ?

## 2021-08-10 NOTE — Assessment & Plan Note (Signed)
Uncontrolled, no compliant and remains in denial ?DASH diet and commitment to daily physical activity for a minimum of 30 minutes discussed and encouraged, as a part of hypertension management. ?The importance of attaining a healthy weight is also discussed. ? ? ?  08/10/2021  ?  2:36 PM 06/01/2021  ?  2:43 PM 06/01/2021  ?  2:14 PM 03/07/2021  ?  9:39 AM 02/27/2021  ?  2:13 PM 11/23/2020  ? 10:43 PM 11/23/2020  ? 10:07 PM  ?BP/Weight  ?Systolic BP 268 341 962 229 146 127 119  ?Diastolic BP 98 92 89 78 93 78 84  ?Wt. (Lbs) 227.4  223.04 217.12 220    ?BMI 35.62 kg/m2  34.93 kg/m2 34.01 kg/m2 34.46 kg/m2    ? ?States she WILL take her meds ? ?

## 2021-08-10 NOTE — Progress Notes (Signed)
? ?Kendra Wong     MRN: [id number]      DOB: October 30, 1975 ? ? ?HPI ?Kendra Wong is here for follow up and re-evaluation of chronic medical conditions, medication management and review of any available recent lab and radiology data.  ?Preventive health is updated, specifically  Cancer screening and Immunization.   ?Not taking BP med consistently, still feels she can control BP without med ?No regular exercise, and eating varies with mood ?Challenged with l;ife , and her marriage, recently requeted tox screen a ?C/o urinary frequency x 7 to 10 days, no fever, chills or flankpain ? ?ROS ?Denies recent fever or chills. ?Denies sinus pressure, nasal congestion, ear pain or sore throat. ?Denies chest congestion, productive cough or wheezing. ?Denies chest pains, palpitations and leg swelling ?Denies abdominal pain, nausea, vomiting,diarrhea or constipation.   ?Denies dysuria, hesitancy or incontinence. ?Denies joint pain, swelling and limitation in mobility. ?Denies headaches, seizures, numbness, or tingling. ?Denies skin break down or rash. ? ? ?PE ? ?BP (!) 142/98   Pulse 79   Resp 16   Ht '5\' 7"'$  (1.702 m)   Wt 227 lb 6.4 oz (103.1 kg)   LMP 07/27/2021   SpO2 95%   BMI 35.62 kg/m?  ? ?Patient alert and oriented and in no cardiopulmonary distress. ? ?HEENT: No facial asymmetry, EOMI,     Neck supple . ? ?Chest: Clear to auscultation bilaterally. ? ?CVS: S1, S2 no murmurs, no S3.Regular rate. ? ?ABD: Soft non tender.  ? ?Ext: No edema ? ?MS: Adequate ROM spine, shoulders, hips and knees. ? ?Skin: Intact, no ulcerations or rash noted. ? ?Psych: Good eye contact, normal affect. Memory intact mildy anxious and  depressed appearing. At times tearful; ? ?CNS: CN 2-12 intact, power,  normal throughout.no focal deficits noted. ? ? ?Assessment & Plan ? ?Essential hypertension ?Uncontrolled, no compliant and remains in denial ?DASH diet and commitment to daily physical activity for a minimum of 30 minutes discussed and  encouraged, as a part of hypertension management. ?The importance of attaining a healthy weight is also discussed. ? ? ?  08/10/2021  ?  2:36 PM 06/01/2021  ?  2:43 PM 06/01/2021  ?  2:14 PM 03/07/2021  ?  9:39 AM 02/27/2021  ?  2:13 PM 11/23/2020  ? 10:43 PM 11/23/2020  ? 10:07 PM  ?BP/Weight  ?Systolic BP 423 536 144 315 146 127 119  ?Diastolic BP 98 92 89 78 93 78 84  ?Wt. (Lbs) 227.4  223.04 217.12 220    ?BMI 35.62 kg/m2  34.93 kg/m2 34.01 kg/m2 34.46 kg/m2    ? ?States she WILL take her meds ? ? ?Obesity (BMI 30.0-34.9) ? ?Patient re-educated about  the importance of commitment to a  minimum of 150 minutes of exercise per week as able. ? ?The importance of healthy food choices with portion control discussed, as well as eating regularly and within a 12 hour window most days. ?The need to choose "clean , green" food 50 to 75% of the time is discussed, as well as to make water the primary drink and set a goal of 64 ounces water daily. ? ?  ? ?  08/10/2021  ?  2:36 PM 06/01/2021  ?  2:14 PM 03/07/2021  ?  9:39 AM  ?Weight /BMI  ?Weight 227 lb 6.4 oz 223 lb 0.6 oz 217 lb 1.9 oz  ?Height '5\' 7"'$  (1.702 m) '5\' 7"'$  (1.702 m) '5\' 7"'$  (1.702 m)  ?BMI 35.62 kg/m2 34.93  kg/m2 34.01 kg/m2  ? ? ? ? ?Urinary frequency ?2 week history, CCUAsend c/s if abn, no infectio on testing, pt reassured ? ?Seasonal allergies ?Controlled, no change in medication ? ? ? ?

## 2021-08-10 NOTE — Assessment & Plan Note (Signed)
?  Patient re-educated about  the importance of commitment to a  minimum of 150 minutes of exercise per week as able. ? ?The importance of healthy food choices with portion control discussed, as well as eating regularly and within a 12 hour window most days. ?The need to choose "clean , green" food 50 to 75% of the time is discussed, as well as to make water the primary drink and set a goal of 64 ounces water daily. ? ?  ? ?  08/10/2021  ?  2:36 PM 06/01/2021  ?  2:14 PM 03/07/2021  ?  9:39 AM  ?Weight /BMI  ?Weight 227 lb 6.4 oz 223 lb 0.6 oz 217 lb 1.9 oz  ?Height '5\' 7"'$  (1.702 m) '5\' 7"'$  (1.702 m) '5\' 7"'$  (1.702 m)  ?BMI 35.62 kg/m2 34.93 kg/m2 34.01 kg/m2  ? ? ? ?

## 2021-08-13 ENCOUNTER — Encounter: Payer: Self-pay | Admitting: Family Medicine

## 2021-08-13 NOTE — Assessment & Plan Note (Signed)
Controlled, no change in medication  

## 2021-08-30 ENCOUNTER — Telehealth: Payer: Self-pay

## 2021-08-30 NOTE — Telephone Encounter (Signed)
Pt LMOVM wanting to get in to be seen. States that telehealth advised her to call to be evaluated. Pt states she has been bleeding x 3 days. She can be reached @ [phone]. ?

## 2021-09-01 ENCOUNTER — Encounter: Payer: Self-pay | Admitting: Gastroenterology

## 2021-09-01 NOTE — Progress Notes (Signed)
? ? ?Referring Provider: Fayrene Helper, MD ?Primary Care Physician:  Fayrene Helper, MD ?Primary GI Physician: Dr. Abbey Chatters ? ?Chief Complaint  ?Patient presents with  ? Rectal Bleeding  ?  Bleeding for several days off and on with clots. Some left side pain with stopped and now its the right side.  ? ? ?HPI:   ?Kendra Wong is a 46 y.o. female presenting today with chief complaint of  ? ?Last see in our office May 2022 for new onset rectal bleeding x 1 week with 3 total occurrences. no associated abdominal pain or rectal pain at that time. 1 year history of 1-2 loose BMs daily.  Rare constipation. History of mild intermittent lower abdominal cramping without identified trigger. Recent CBC wnl. Recommended colonoscopy.  ? ?Colonoscopy 09/18/20:  ?- Non-bleeding internal hemorrhoids. ?- One 2 mm polyp in the transverse colon, removed with a cold biopsy forceps. Resected ?and retrieved. ?- The examination was otherwise normal. ?- Pathology with tubular adenoma. Recommended 7 year repeat.  ? ? ?Today:  ?Rectal bleeding had resolved in May 2022, but returned on 08/28/21. She had a fairly large amount of bright red blood on toilet tissue and also a small amount in the toilet water.  First episode was without a bowel movement.  She had a second episode of rectal bleeding with a bowel movement on 5/4, but has not had any recurrent symptoms.  Denies constipation.  Typically with 1-2 soft, formed, or mushy stool daily.  Denies straining.  No associated rectal pain. ? ?She did have some left lower quadrant aching and also some right lower quadrant aching 1-2 weeks prior to rectal bleeding.  Symptoms are intermittent.  She was on her menstrual cycle at that time.  Abdominal pain has resolved, but has noticed some right flank aching in the morning when she wakes up if she is laying on her right side.  She also has chronic low back pain that has been intermittent, but pain has been flared up for the last 1-2 months.   She has been out in her garden recently planting.  Pain radiates across her low back into her right flank.  Denies any urinary symptoms. ? ?Denies any abdominal pain or worsening flank pain related to bowel movements or meals. ? ?Last year or so, she has been having more cramping with menstrual cycles. Takes Aleve or Advil occasionally, but nothing routine. May have taken 2 in the last few weeks. ? ?Denies any associated nausea, vomiting, or fever. ? ?No Fhx of IBD that she is aware of. Mom passed when she was little. Grandmother with colon cancer in 72s.  ? ? ?Past Medical History:  ?Diagnosis Date  ? Hypertension   ? Obesity   ? ? ?Past Surgical History:  ?Procedure Laterality Date  ? COLONOSCOPY WITH PROPOFOL N/A 09/18/2020  ? Surgeon: Hurshel Keys K, DO; Non-bleeding internal hemorrhoids, 2 mm tubular adenoma. Repeat in 7 years.  ? POLYPECTOMY  09/18/2020  ? Procedure: POLYPECTOMY;  Surgeon: Eloise Harman, DO;  Location: [facility];  Service: Endoscopy;;  ? TONSILLECTOMY    ? TYMPANOSTOMY TUBE PLACEMENT    ? ? ?Current Outpatient Medications  ?Medication Sig Dispense Refill  ? acetaminophen (TYLENOL) 500 MG tablet Take 500 mg by mouth every 6 (six) hours as needed for headache.    ? amLODipine (NORVASC) 2.5 MG tablet Take 1 tablet (2.5 mg total) by mouth daily. 30 tablet 4  ? cetirizine (ZYRTEC) 10 MG tablet Take  1 tablet (10 mg total) by mouth daily. 30 tablet 5  ? EPINEPHrine 0.3 mg/0.3 mL IJ SOAJ injection Inject 0.3 mg into the muscle as needed for anaphylaxis. 1 each 1  ? ergocalciferol (VITAMIN D2) 1.25 MG (50000 UT) capsule Take 1 capsule (50,000 Units total) by mouth once a week. One capsule once weekly 12 capsule 1  ? hydrocortisone (ANUSOL-HC) 2.5 % rectal cream Place 1 application. rectally 2 (two) times daily. 30 g 1  ? ?No current facility-administered medications for this visit.  ? ? ?Allergies as of 09/03/2021 - Review Complete 09/03/2021  ?Allergen Reaction Noted  ? Bee venom  Anaphylaxis 11/23/2020  ? Sulfa antibiotics  09/23/2014  ? ? ?Family History  ?Problem Relation Age of Onset  ? Diabetes Father   ? Colon cancer Maternal Grandmother   ?     early 8s  ? Colon cancer Other   ? Inflammatory bowel disease Neg Hx   ? ? ?Social History  ? ?Socioeconomic History  ? Marital status: Married  ?  Spouse name: Not on file  ? Number of children: 2  ? Years of education: Not on file  ? Highest education level: Not on file  ?Occupational History  ? Occupation: unemployed   ?Tobacco Use  ? Smoking status: Never  ? Smokeless tobacco: Never  ?Substance and Sexual Activity  ? Alcohol use: Yes  ?  Comment: 4 mixed drinks on the weekend.  ? Drug use: No  ? Sexual activity: Not on file  ?Other Topics Concern  ? Not on file  ?Social History Narrative  ? Non smoker  ? ?Social Determinants of Health  ? ?Financial Resource Strain: Not on file  ?Food Insecurity: Not on file  ?Transportation Needs: Not on file  ?Physical Activity: Not on file  ?Stress: Not on file  ?Social Connections: Not on file  ? ? ?Review of Systems: ?Gen: Denies fever, chills, cold or flu like symptoms, pre-syncope, or syncope.  ?CV: Denies chest pain or palpitations. ?Resp: Denies dyspnea or cough. ?GI: See HPI ?Heme: See HPI ? ?Physical Exam: ?BP 120/70   Pulse 72   Temp (!) 97.3 ?F (36.3 ?C)   Ht '5\' 7"'$  (1.702 m)   Wt 227 lb 6.4 oz (103.1 kg)   LMP 08/17/2021   BMI 35.62 kg/m?  ?General:   Alert and oriented. No distress noted. Pleasant and cooperative.  ?Head:  Normocephalic and atraumatic. ?Eyes:  Conjuctiva clear without scleral icterus. ?Heart:  S1, S2 present without murmurs appreciated. ?Lungs:  Clear to auscultation bilaterally. No wheezes, rales, or rhonchi. No distress.  ?Abdomen:  +BS, soft, non-tender and non-distended. No rebound or guarding. No HSM or masses noted. ?Msk:  Symmetrical without gross deformities. Normal posture. ?Extremities:  Without edema. ?Neurologic:  Alert and  oriented x4 ?Psych:  Normal mood  and affect. ? ? ? ?Assessment:  ?46 year old female with history of rectal bleeding in the setting of internal hemorrhoids noted on colonoscopy in May 2022, also with single small tubular adenoma, due for surveillance colonoscopy in 2029, presenting today with chief complaint of rectal bleeding, abdominal pain, and back pain.  ? ?Rectal bleeding/abdominal pain:  ?History of short-lived rectal bleeding 1 year ago with colonoscopy at that time revealing nonbleeding internal hemorrhoids and single small tubular adenoma rectal bleeding resolved until 5/2 and she had bright red blood per rectum on toilet tissue and small amount in toilet water.  Second episode of rectal bleeding on 5/4, but no recurrent symptoms since that  time. Denies rectal pain or constipation.  She did have some left lower quadrant and right lower quadrant abdominal pain starting about 1-2 weeks prior to rectal bleeding, but she was also on her menstrual cycle. Abdominal pain has essentially resolved. Now having dull right side/flank pain when waking in the morning if she is laying on that side, but also has low back pain as well. Denies fever, nausea, vomiting, abdominal pain associated with BMs, or meals.  ? ?I suspect her rectal bleeding was likely secondary to known hemorrhoids. Can't rule out episode of ischemic colitis in setting of abdominal pain, but I feel this is less likely. Suspect abdominal pain related to menstrual cycle. As her abdominal exam is benign, no need for imaging at this time. Will treat hemorrhoids with Anusol rectal cream. Discussed hemorrhoid banding if recurrent rectal bleeding. ? ?Low back pain/right flank pain: ?Suspect MSK etiology.  Chronic history of low back pain that is been intermittent.  Flared over the last couple of months and reports she has been out planting in her garden.  Pain radiates across her lower back to her right flank.  Denies urinary symptoms.  Recommended follow-up with PCP for further evaluation.   ? ? ?Plan:  ?May use Anusol rectal cream twice daily x7-10 days for rectal bleeding/hemorrhoid symptoms. ?Consider hemorrhoid banding if recurrent rectal bleeding. ?Patient will monitor for recurrent abdominal pain an

## 2021-09-03 ENCOUNTER — Encounter: Payer: Self-pay | Admitting: Gastroenterology

## 2021-09-03 ENCOUNTER — Ambulatory Visit (INDEPENDENT_AMBULATORY_CARE_PROVIDER_SITE_OTHER): Payer: PRIVATE HEALTH INSURANCE | Admitting: Gastroenterology

## 2021-09-03 VITALS — BP 120/70 | HR 72 | Temp 97.3°F | Ht 67.0 in | Wt 227.4 lb

## 2021-09-03 DIAGNOSIS — K649 Unspecified hemorrhoids: Secondary | ICD-10-CM | POA: Diagnosis not present

## 2021-09-03 DIAGNOSIS — K625 Hemorrhage of anus and rectum: Secondary | ICD-10-CM

## 2021-09-03 DIAGNOSIS — M545 Low back pain, unspecified: Secondary | ICD-10-CM

## 2021-09-03 DIAGNOSIS — R1030 Lower abdominal pain, unspecified: Secondary | ICD-10-CM

## 2021-09-03 MED ORDER — HYDROCORTISONE (PERIANAL) 2.5 % EX CREA
1.0000 | TOPICAL_CREAM | Freq: Two times a day (BID) | CUTANEOUS | 1 refills | Status: DC
Start: 2021-09-03 — End: 2023-10-07

## 2021-09-03 NOTE — Patient Instructions (Addendum)
I am sending in Anusol rectal cream for you to use if you have recurrent rectal bleeding as I suspect this is secondary to your known internal hemorrhoids.  We will apply the cream twice daily for 7 to 10 days at a time, then stop.  Use again as needed. ? ?I suspect your right sided pain is more likely musculoskeletal related to your back pain.  Follow-up with your primary care provider on this. ? ?If you have recurrent abdominal pain, let me know. ? ?We will follow-up with you as needed.  Do not hesitate to call if you have any questions or concerns. ? ?It was good to see you again today!  ? ?Aliene Altes, PA-C ?[facility] ? ?

## 2021-12-14 ENCOUNTER — Ambulatory Visit: Payer: PRIVATE HEALTH INSURANCE | Admitting: Family Medicine

## 2021-12-21 LAB — CBC
Hematocrit: 39 % (ref 34.0–46.6)
Hemoglobin: 12.4 g/dL (ref 11.1–15.9)
MCH: 25.8 pg — ABNORMAL LOW (ref 26.6–33.0)
MCHC: 31.8 g/dL (ref 31.5–35.7)
MCV: 81 fL (ref 79–97)
Platelets: 277 10*3/uL (ref 150–450)
RBC: 4.81 x10E6/uL (ref 3.77–5.28)
RDW: 14.1 % (ref 11.7–15.4)
WBC: 3.8 10*3/uL (ref 3.4–10.8)

## 2021-12-21 LAB — BMP8+EGFR
BUN/Creatinine Ratio: 11 (ref 9–23)
BUN: 11 mg/dL (ref 6–24)
CO2: 25 mmol/L (ref 20–29)
Calcium: 9.4 mg/dL (ref 8.7–10.2)
Chloride: 102 mmol/L (ref 96–106)
Creatinine, Ser: 0.96 mg/dL (ref 0.57–1.00)
Glucose: 96 mg/dL (ref 70–99)
Potassium: 4.7 mmol/L (ref 3.5–5.2)
Sodium: 142 mmol/L (ref 134–144)
eGFR: 74 mL/min/{1.73_m2} (ref >59)

## 2021-12-21 LAB — LIPID PANEL
Chol/HDL Ratio: 4.1 ratio (ref 0.0–4.4)
Cholesterol, Total: 212 mg/dL — ABNORMAL HIGH (ref 100–199)
HDL: 52 mg/dL (ref >39)
LDL Chol Calc (NIH): 137 mg/dL — ABNORMAL HIGH (ref 0–99)
Triglycerides: 126 mg/dL (ref 0–149)
VLDL Cholesterol Cal: 23 mg/dL (ref 5–40)

## 2021-12-21 LAB — TSH: TSH: 1.72 u[IU]/mL (ref 0.450–4.500)

## 2021-12-21 LAB — VITAMIN D 25 HYDROXY (VIT D DEFICIENCY, FRACTURES): Vit D, 25-Hydroxy: 19.5 ng/mL — ABNORMAL LOW (ref 30.0–100.0)

## 2021-12-28 ENCOUNTER — Ambulatory Visit (INDEPENDENT_AMBULATORY_CARE_PROVIDER_SITE_OTHER): Payer: PRIVATE HEALTH INSURANCE | Admitting: Family Medicine

## 2021-12-28 ENCOUNTER — Encounter: Payer: Self-pay | Admitting: Family Medicine

## 2021-12-28 VITALS — BP 134/89 | HR 66 | Ht 67.0 in | Wt 220.0 lb

## 2021-12-28 DIAGNOSIS — R208 Other disturbances of skin sensation: Secondary | ICD-10-CM

## 2021-12-28 DIAGNOSIS — J029 Acute pharyngitis, unspecified: Secondary | ICD-10-CM

## 2021-12-28 DIAGNOSIS — E669 Obesity, unspecified: Secondary | ICD-10-CM

## 2021-12-28 DIAGNOSIS — E785 Hyperlipidemia, unspecified: Secondary | ICD-10-CM | POA: Diagnosis not present

## 2021-12-28 DIAGNOSIS — Z2821 Immunization not carried out because of patient refusal: Secondary | ICD-10-CM

## 2021-12-28 DIAGNOSIS — D72819 Decreased white blood cell count, unspecified: Secondary | ICD-10-CM

## 2021-12-28 DIAGNOSIS — I10 Essential (primary) hypertension: Secondary | ICD-10-CM

## 2021-12-28 MED ORDER — AMLODIPINE BESYLATE 5 MG PO TABS: 5.0000 mg | ORAL_TABLET | Freq: Every day | ORAL | 3 refills | Status: DC

## 2021-12-28 MED ORDER — ERGOCALCIFEROL 1.25 MG (50000 UT) PO CAPS: 50000.0000 [IU] | ORAL_CAPSULE | ORAL | 3 refills | Status: DC

## 2021-12-28 NOTE — Patient Instructions (Signed)
Annual exam  end November, early December, call if you need me sooner  INCREASE dose of amlodipine 5 mg ONE daily ( OK to take two 2.5 mg amlodipine tabs daily , till done)  Please resume and continue once weekly vit D, your level is low  Continue to increase plant based eating and lower fat intake, your cholesterol is better and you have lost 7 pounds since you were last here  It is important that you exercise regularly at least 30 minutes 5 times a week. If you develop chest pain, have severe difficulty breathing, or feel very tired, stop exercising immediately and seek medical attention   Thanks for choosing  [facility], we consider it a privelige to serve you.

## 2021-12-31 ENCOUNTER — Encounter: Payer: Self-pay | Admitting: Family Medicine

## 2021-12-31 DIAGNOSIS — R208 Other disturbances of skin sensation: Secondary | ICD-10-CM | POA: Insufficient documentation

## 2021-12-31 NOTE — Progress Notes (Signed)
Kendra Wong     MRN: [id number]      DOB: Mar 09, 1976   HPI Kendra Wong is here for follow up and re-evaluation of chronic medical conditions, medication management and review of any available recent lab and radiology data.  Preventive health is updated, specifically  Cancer screening and Immunization.   Questions or concerns regarding consultations or procedures which the PT has had in the interim are  addressed. The PT denies any adverse reactions to current medications since the last visit.  Tingling and burning of left leg after wearing boots and dancing all night, also burning across toes after wearing a specific shoe C/o sore throat , no fevr or cills , also heartburn and reflux symptoms  ROS Denies recent fever or chills. Denies sinus pressure, nasal congestion, ear pain . Denies chest congestion, productive cough or wheezing. Denies chest pains, palpitations and leg swelling Denies abdominal pain, nausea, vomiting,diarrhea or constipation.   Denies dysuria, frequency, hesitancy or incontinence. Denies joint pain, swelling and limitation in mobility. Denies headaches, seizures, . Denies depression, anxiety or insomnia. Denies skin break down or rash.   PE  BP 134/89 (BP Location: Left Arm, Patient Position: Sitting, Cuff Size: Large)   Pulse 66   Ht '5\' 7"'$  (1.702 m)   Wt 220 lb (99.8 kg)   LMP 12/16/2021 (Exact Date)   SpO2 93%   BMI 34.46 kg/m   Patient alert and oriented and in no cardiopulmonary distress.  HEENT: No facial asymmetry, EOMI,     Neck supple .  Chest: Clear to auscultation bilaterally.  CVS: S1, S2 no murmurs, no S3.Regular rate.  ABD: Soft non tender.   Ext: No edema  MS: Adequate ROM spine, shoulders, hips and knees.  Skin: Intact, no ulcerations or rash noted.  Psych: Good eye contact, normal affect. Memory intact not anxious or depressed appearing.  CNS: CN 2-12 intact, power,  normal throughout.no focal deficits  noted.   Assessment & Plan  Essential hypertension Improved but not controlled, inc dose amlodipine DASH diet and commitment to daily physical activity for a minimum of 30 minutes discussed and encouraged, as a part of hypertension management. The importance of attaining a healthy weight is also discussed.     12/28/2021   10:47 AM 09/03/2021    8:50 AM 08/10/2021    2:36 PM 06/01/2021    2:43 PM 06/01/2021    2:14 PM 03/07/2021    9:39 AM 02/27/2021    2:13 PM  BP/Weight  Systolic BP 409 811 914 782 956 213 086  Diastolic BP 89 70 98 92 89 78 93  Wt. (Lbs) 220 227.4 227.4  223.04 217.12 220  BMI 34.46 kg/m2 35.62 kg/m2 35.62 kg/m2  34.93 kg/m2 34.01 kg/m2 34.46 kg/m2       Hyperlipidemia LDL goal <100 Hyperlipidemia:Low fat diet discussed and encouraged.   Lipid Panel  Lab Results  Component Value Date   CHOL 212 (H) 12/20/2021   HDL 52 12/20/2021   LDLCALC 137 (H) 12/20/2021   TRIG 126 12/20/2021   CHOLHDL 4.1 12/20/2021     Improved, but not yet at goal, continue low fat diet  Obesity (BMI 30.0-34.9) Improved  Patient re-educated about  the importance of commitment to a  minimum of 150 minutes of exercise per week as able.  The importance of healthy food choices with portion control discussed, as well as eating regularly and within a 12 hour window most days. The need to choose "clean ,  green" food 50 to 75% of the time is discussed, as well as to make water the primary drink and set a goal of 64 ounces water daily.       12/28/2021   10:47 AM 09/03/2021    8:50 AM 08/10/2021    2:36 PM  Weight /BMI  Weight 220 lb 227 lb 6.4 oz 227 lb 6.4 oz  Height '5\' 7"'$  (1.702 m) '5\' 7"'$  (1.702 m) '5\' 7"'$  (1.702 m)  BMI 34.46 kg/m2 35.62 kg/m2 35.62 kg/m2      Refused influenza vaccine Offered and does not want vaccine  Leukopenia stable  Burning sensation of lower extremity liklely due to arhtiritis in low back as well as ill fitting shoe  Sore throat liklely due to  GERD, willmodify caffeine intake and eat earlier in the pm

## 2021-12-31 NOTE — Assessment & Plan Note (Signed)
Offered and does not want vaccine

## 2021-12-31 NOTE — Assessment & Plan Note (Signed)
stable °

## 2021-12-31 NOTE — Assessment & Plan Note (Signed)
Improved but not controlled, inc dose amlodipine DASH diet and commitment to daily physical activity for a minimum of 30 minutes discussed and encouraged, as a part of hypertension management. The importance of attaining a healthy weight is also discussed.     12/28/2021   10:47 AM 09/03/2021    8:50 AM 08/10/2021    2:36 PM 06/01/2021    2:43 PM 06/01/2021    2:14 PM 03/07/2021    9:39 AM 02/27/2021    2:13 PM  BP/Weight  Systolic BP 132 440 102 725 366 440 347  Diastolic BP 89 70 98 92 89 78 93  Wt. (Lbs) 220 227.4 227.4  223.04 217.12 220  BMI 34.46 kg/m2 35.62 kg/m2 35.62 kg/m2  34.93 kg/m2 34.01 kg/m2 34.46 kg/m2

## 2021-12-31 NOTE — Assessment & Plan Note (Signed)
Hyperlipidemia:Low fat diet discussed and encouraged.   Lipid Panel  Lab Results  Component Value Date   CHOL 212 (H) 12/20/2021   HDL 52 12/20/2021   LDLCALC 137 (H) 12/20/2021   TRIG 126 12/20/2021   CHOLHDL 4.1 12/20/2021     Improved, but not yet at goal, continue low fat diet

## 2021-12-31 NOTE — Assessment & Plan Note (Signed)
liklely due to arhtiritis in low back as well as ill fitting shoe

## 2021-12-31 NOTE — Assessment & Plan Note (Signed)
liklely due to GERD, willmodify caffeine intake and eat earlier in the pm

## 2021-12-31 NOTE — Assessment & Plan Note (Signed)
Improved  Patient re-educated about  the importance of commitment to a  minimum of 150 minutes of exercise per week as able.  The importance of healthy food choices with portion control discussed, as well as eating regularly and within a 12 hour window most days. The need to choose "clean , green" food 50 to 75% of the time is discussed, as well as to make water the primary drink and set a goal of 64 ounces water daily.       12/28/2021   10:47 AM 09/03/2021    8:50 AM 08/10/2021    2:36 PM  Weight /BMI  Weight 220 lb 227 lb 6.4 oz 227 lb 6.4 oz  Height '5\' 7"'$  (1.702 m) '5\' 7"'$  (1.702 m) '5\' 7"'$  (1.702 m)  BMI 34.46 kg/m2 35.62 kg/m2 35.62 kg/m2

## 2022-04-10 ENCOUNTER — Encounter: Payer: PRIVATE HEALTH INSURANCE | Admitting: Family Medicine

## 2022-04-26 HISTORY — PX: WISDOM TOOTH EXTRACTION: SHX21

## 2022-04-30 ENCOUNTER — Encounter: Payer: PRIVATE HEALTH INSURANCE | Admitting: Family Medicine

## 2022-05-31 ENCOUNTER — Encounter: Payer: PRIVATE HEALTH INSURANCE | Admitting: Family Medicine

## 2022-06-13 ENCOUNTER — Encounter: Payer: Self-pay | Admitting: Family Medicine

## 2022-06-13 ENCOUNTER — Ambulatory Visit (INDEPENDENT_AMBULATORY_CARE_PROVIDER_SITE_OTHER): Payer: 59 | Admitting: Family Medicine

## 2022-06-13 VITALS — BP 146/94 | HR 68 | Ht 67.0 in | Wt 220.1 lb

## 2022-06-13 DIAGNOSIS — I10 Essential (primary) hypertension: Secondary | ICD-10-CM

## 2022-06-13 DIAGNOSIS — E559 Vitamin D deficiency, unspecified: Secondary | ICD-10-CM

## 2022-06-13 DIAGNOSIS — E669 Obesity, unspecified: Secondary | ICD-10-CM

## 2022-06-13 DIAGNOSIS — Z1231 Encounter for screening mammogram for malignant neoplasm of breast: Secondary | ICD-10-CM

## 2022-06-13 DIAGNOSIS — Z0001 Encounter for general adult medical examination with abnormal findings: Secondary | ICD-10-CM | POA: Diagnosis not present

## 2022-06-13 DIAGNOSIS — L237 Allergic contact dermatitis due to plants, except food: Secondary | ICD-10-CM | POA: Diagnosis not present

## 2022-06-13 DIAGNOSIS — F329 Major depressive disorder, single episode, unspecified: Secondary | ICD-10-CM

## 2022-06-13 DIAGNOSIS — J3089 Other allergic rhinitis: Secondary | ICD-10-CM

## 2022-06-13 DIAGNOSIS — E785 Hyperlipidemia, unspecified: Secondary | ICD-10-CM

## 2022-06-13 MED ORDER — ERGOCALCIFEROL 1.25 MG (50000 UT) PO CAPS: 50000.0000 [IU] | ORAL_CAPSULE | ORAL | 3 refills | Status: DC

## 2022-06-13 MED ORDER — CETIRIZINE HCL 10 MG PO TABS: 10.0000 mg | ORAL_TABLET | Freq: Every day | ORAL | 5 refills | Status: DC

## 2022-06-13 MED ORDER — AMLODIPINE BESYLATE 5 MG PO TABS: 5.0000 mg | ORAL_TABLET | Freq: Every day | ORAL | 1 refills | Status: DC

## 2022-06-13 NOTE — Patient Instructions (Addendum)
F/U in 3 months, call if you need me sooner  Nurse blood pressure check in 4 to 6 weeks   PLease get fasting cBC, lipid, cmp and EGFr, hBa1C, TSH and vit D 3 to 5 days before appointment  Please schedule mammogram at checkout   It is important that you exercise regularly at least 30 minutes 5 times a week. If you develop chest pain, have severe difficulty breathing, or feel very tired, stop exercising immediately and seek medical attention   You will be referred to P Bynum , I will send a message when she confirms   Right lower pain seems to be over bone not abdomen , however if persisits and you are concerned send a messageI will order imaging   Thanks for choosing [facility], we consider it a privelige to serve you.

## 2022-06-14 ENCOUNTER — Other Ambulatory Visit: Payer: Self-pay | Admitting: Family Medicine

## 2022-06-16 ENCOUNTER — Encounter: Payer: Self-pay | Admitting: Family Medicine

## 2022-06-16 DIAGNOSIS — Z0001 Encounter for general adult medical examination with abnormal findings: Secondary | ICD-10-CM | POA: Insufficient documentation

## 2022-06-16 NOTE — Assessment & Plan Note (Signed)
  Patient re-educated about  the importance of commitment to a  minimum of 150 minutes of exercise per week as able.  The importance of healthy food choices with portion control discussed, as well as eating regularly and within a 12 hour window most days. The need to choose "clean , green" food 50 to 75% of the time is discussed, as well as to make water the primary drink and set a goal of 64 ounces water daily.       06/13/2022    9:16 AM 12/28/2021   10:47 AM 09/03/2021    8:50 AM  Weight /BMI  Weight 220 lb 1.3 oz 220 lb 227 lb 6.4 oz  Height 5' 7"$  (1.702 m) 5' 7"$  (1.702 m) 5' 7"$  (1.702 m)  BMI 34.47 kg/m2 34.46 kg/m2 35.62 kg/m2

## 2022-06-16 NOTE — Assessment & Plan Note (Signed)
Recommend therapy and will reach out, pt agrees, states most of her stress is with the  "Work of marriage" Encouraged exercise commitment, and sttaes her sleep is currently good Encouraged improved self esteem

## 2022-06-16 NOTE — Assessment & Plan Note (Signed)
Hyperlipidemia:Low fat diet discussed and encouraged.   Lipid Panel  Lab Results  Component Value Date   CHOL 212 (H) 12/20/2021   HDL 52 12/20/2021   LDLCALC 137 (H) 12/20/2021   TRIG 126 12/20/2021   CHOLHDL 4.1 12/20/2021     Updated lab needed at/ before next visit.

## 2022-06-16 NOTE — Assessment & Plan Note (Signed)
Uncontrolled , increase med dose and Nurse re eval in 4 to 6 weeks DASH diet and commitment to daily physical activity for a minimum of 30 minutes discussed and encouraged, as a part of hypertension management. The importance of attaining a healthy weight is also discussed.     06/13/2022   10:06 AM 06/13/2022    9:16 AM 12/28/2021   10:47 AM 09/03/2021    8:50 AM 08/10/2021    2:36 PM 06/01/2021    2:43 PM 06/01/2021    2:14 PM  BP/Weight  Systolic BP 123456 Q000111Q Q000111Q 123456 A999333 Q000111Q Q000111Q  Diastolic BP 94 90 89 70 98 92 89  Wt. (Lbs)  220.08 220 227.4 227.4  223.04  BMI  34.47 kg/m2 34.46 kg/m2 35.62 kg/m2 35.62 kg/m2  34.93 kg/m2

## 2022-06-16 NOTE — Assessment & Plan Note (Signed)
Uncontrolled , start zyrtec

## 2022-06-16 NOTE — Assessment & Plan Note (Signed)

## 2022-06-16 NOTE — Progress Notes (Signed)
Kendra Wong     MRN: [id number]      DOB: 19-Dec-1975  HPI: Patient is in for annual physical exam. Blood pressure remains uncontrolled and is still not taking medication every day as prescribed.Med dose is increased with Nurse BP check in 4 to 6 weeks Positive depression screen, not suicidal or homicidal , states 'marriage is hard work" interested in  Therapy but with no one who  knows her, will try to get P Bynum  C/o intermittent RLQ pain posterior, intermittent x 1 week Labs will be updated for next visit Immunization is reviewed , and  updated if needed.   PE: BP (!) 146/94   Pulse 68   Ht 5' 7"$  (1.702 m)   Wt 220 lb 1.3 oz (99.8 kg)   SpO2 95%   BMI 34.47 kg/m   Pleasant  female, alert and oriented x 3, in no cardio-pulmonary distress. Afebrile. HEENT No facial trauma or asymetry. Sinuses non tender.  Extra occullar muscles intact.. External ears normal, . Neck: supple  Chest: Clear to ascultation bilaterally.No crackles or wheezes.    Cardiovascular system; Heart sounds normal,  S1 and  S2 ,no S3.  No murmur, or thrill.  Peripheral pulses normal.  Abdomen: Soft, no localized tenderness , guarding or rebound    Musculoskeletal exam: Tender over right sacrum No deformity ,swelling or crepitus noted. No muscle wasting or atrophy.   Neurologic: Cranial nerves 2 to 12 intact.  No disturbance in gait. No tremor.  Skin: Intact, no ulceration, erythema , scaling or rash noted. Pigmentation normal throughout  Psych; Normal mood and affect. Judgement and concentration normal   Assessment & Plan:  Annual visit for general adult medical examination with abnormal findings Annual exam as documented. Counseling done  re healthy lifestyle involving commitment to 150 minutes exercise per week, heart healthy diet, and attaining healthy weight.The importance of adequate sleep also discussed. Regular seat belt use and home safety, is also discussed. Changes  in health habits are decided on by the patient with goals and time frames  set for achieving them. Immunization and cancer screening needs are specifically addressed at this visit.   Essential hypertension Uncontrolled , increase med dose and Nurse re eval in 4 to 6 weeks DASH diet and commitment to daily physical activity for a minimum of 30 minutes discussed and encouraged, as a part of hypertension management. The importance of attaining a healthy weight is also discussed.     06/13/2022   10:06 AM 06/13/2022    9:16 AM 12/28/2021   10:47 AM 09/03/2021    8:50 AM 08/10/2021    2:36 PM 06/01/2021    2:43 PM 06/01/2021    2:14 PM  BP/Weight  Systolic BP 123456 Q000111Q Q000111Q 123456 A999333 Q000111Q Q000111Q  Diastolic BP 94 90 89 70 98 92 89  Wt. (Lbs)  220.08 220 227.4 227.4  223.04  BMI  34.47 kg/m2 34.46 kg/m2 35.62 kg/m2 35.62 kg/m2  34.93 kg/m2       Depression Recommend therapy and will reach out, pt agrees, states most of her stress is with the  "Work of marriage" Encouraged exercise commitment, and sttaes her sleep is currently good Encouraged improved self esteem  Obesity (BMI 30.0-34.9)  Patient re-educated about  the importance of commitment to a  minimum of 150 minutes of exercise per week as able.  The importance of healthy food choices with portion control discussed, as well as eating regularly and within a 12  hour window most days. The need to choose "clean , green" food 50 to 75% of the time is discussed, as well as to make water the primary drink and set a goal of 64 ounces water daily.       06/13/2022    9:16 AM 12/28/2021   10:47 AM 09/03/2021    8:50 AM  Weight /BMI  Weight 220 lb 1.3 oz 220 lb 227 lb 6.4 oz  Height 5' 7"$  (1.702 m) 5' 7"$  (1.702 m) 5' 7"$  (1.702 m)  BMI 34.47 kg/m2 34.46 kg/m2 35.62 kg/m2      Rhinitis Uncontrolled , start zyrtec  Hyperlipidemia LDL goal <100 Hyperlipidemia:Low fat diet discussed and encouraged.   Lipid Panel  Lab Results  Component Value  Date   CHOL 212 (H) 12/20/2021   HDL 52 12/20/2021   LDLCALC 137 (H) 12/20/2021   TRIG 126 12/20/2021   CHOLHDL 4.1 12/20/2021     Updated lab needed at/ before next visit.

## 2022-06-18 ENCOUNTER — Telehealth (HOSPITAL_COMMUNITY): Payer: Self-pay

## 2022-06-18 ENCOUNTER — Encounter: Payer: Self-pay | Admitting: Family Medicine

## 2022-06-18 NOTE — Telephone Encounter (Signed)
Called pt to schedule np appt with Peggy per Peggy no answer vm full

## 2022-06-18 NOTE — Addendum Note (Signed)
Addended by: Fayrene Helper on: 06/18/2022 10:33 PM   Modules accepted: Orders

## 2022-06-19 NOTE — Telephone Encounter (Signed)
Called pt no answer left vm 

## 2022-06-20 NOTE — Telephone Encounter (Signed)
PT SCHEDULED

## 2022-07-31 ENCOUNTER — Encounter (HOSPITAL_COMMUNITY): Payer: Self-pay

## 2022-07-31 ENCOUNTER — Other Ambulatory Visit: Payer: Self-pay | Admitting: Family Medicine

## 2022-07-31 ENCOUNTER — Ambulatory Visit (HOSPITAL_COMMUNITY)
Admission: RE | Admit: 2022-07-31 | Discharge: 2022-07-31 | Disposition: A | Discharge status: Home / Self Care | Payer: 59 | Source: Ambulatory Visit | Attending: Family Medicine | Admitting: Family Medicine

## 2022-07-31 DIAGNOSIS — N644 Mastodynia: Secondary | ICD-10-CM

## 2022-07-31 DIAGNOSIS — Z1231 Encounter for screening mammogram for malignant neoplasm of breast: Secondary | ICD-10-CM

## 2022-08-01 ENCOUNTER — Encounter (HOSPITAL_COMMUNITY): Payer: Self-pay | Admitting: Psychiatry

## 2022-08-01 ENCOUNTER — Ambulatory Visit (INDEPENDENT_AMBULATORY_CARE_PROVIDER_SITE_OTHER): Payer: 59 | Admitting: Psychiatry

## 2022-08-01 DIAGNOSIS — F320 Major depressive disorder, single episode, mild: Secondary | ICD-10-CM | POA: Diagnosis not present
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
## 2022-08-15 ENCOUNTER — Ambulatory Visit (HOSPITAL_COMMUNITY): Payer: 59

## 2022-08-15 ENCOUNTER — Ambulatory Visit (HOSPITAL_COMMUNITY)
Admission: RE | Admit: 2022-08-15 | Discharge: 2022-08-15 | Disposition: A | Discharge status: Home / Self Care | Payer: 59 | Source: Ambulatory Visit | Attending: Family Medicine | Admitting: Family Medicine

## 2022-08-15 DIAGNOSIS — Z1231 Encounter for screening mammogram for malignant neoplasm of breast: Secondary | ICD-10-CM

## 2022-08-15 DIAGNOSIS — N644 Mastodynia: Secondary | ICD-10-CM | POA: Insufficient documentation

## 2022-08-16 ENCOUNTER — Ambulatory Visit (HOSPITAL_COMMUNITY): Payer: 59

## 2022-08-22 ENCOUNTER — Telehealth: Payer: Self-pay | Admitting: Family Medicine

## 2022-08-22 ENCOUNTER — Other Ambulatory Visit: Payer: Self-pay

## 2022-08-22 MED ORDER — EPINEPHRINE 0.3 MG/0.3ML IJ SOAJ: 0.3000 mg | INTRAMUSCULAR | 1 refills | Status: AC | PRN

## 2022-08-22 NOTE — Telephone Encounter (Signed)
Patient called said her pharmacy does not have the epic pen on file that she has been using.  Pens she has has expired and needs more epic pens called into her pharmacy about 3 of them. Call back # [phone]

## 2022-08-22 NOTE — Telephone Encounter (Signed)
Refill sent to pharmacy.   

## 2022-09-04 ENCOUNTER — Encounter: Payer: Self-pay | Admitting: Family Medicine

## 2022-09-12 ENCOUNTER — Ambulatory Visit: Payer: 59 | Admitting: Family Medicine

## 2022-09-13 ENCOUNTER — Other Ambulatory Visit: Payer: Self-pay | Admitting: Family Medicine

## 2022-09-17 ENCOUNTER — Ambulatory Visit (HOSPITAL_COMMUNITY): Payer: 59 | Admitting: Psychiatry

## 2022-09-17 DIAGNOSIS — F320 Major depressive disorder, single episode, mild: Secondary | ICD-10-CM
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
## 2022-10-25 ENCOUNTER — Ambulatory Visit: Payer: 59 | Admitting: Family Medicine

## 2022-11-18 ENCOUNTER — Telehealth (HOSPITAL_COMMUNITY): Payer: Self-pay | Admitting: Psychiatry

## 2022-11-18 ENCOUNTER — Ambulatory Visit (HOSPITAL_COMMUNITY): Payer: 59 | Admitting: Psychiatry

## 2022-11-18 NOTE — Telephone Encounter (Signed)
Therapist attempted to contact patient via phone regarding scheduled in office appointment and received voicemail message.  Therapist left message indicating attempt and requesting patient call office. 

## 2022-11-19 LAB — LIPID PANEL
Chol/HDL Ratio: 3.6 ratio (ref 0.0–4.4)
Cholesterol, Total: 219 mg/dL — ABNORMAL HIGH (ref 100–199)
HDL: 61 mg/dL (ref >39)
LDL Chol Calc (NIH): 137 mg/dL — ABNORMAL HIGH (ref 0–99)
Triglycerides: 116 mg/dL (ref 0–149)
VLDL Cholesterol Cal: 21 mg/dL (ref 5–40)

## 2022-11-19 LAB — CMP14+EGFR
ALT: 10 IU/L (ref 0–32)
AST: 20 IU/L (ref 0–40)
Albumin: 4.1 g/dL (ref 3.9–4.9)
Alkaline Phosphatase: 73 IU/L (ref 44–121)
BUN/Creatinine Ratio: 13 (ref 9–23)
BUN: 12 mg/dL (ref 6–24)
Bilirubin Total: 0.3 mg/dL (ref 0.0–1.2)
CO2: 23 mmol/L (ref 20–29)
Calcium: 8.8 mg/dL (ref 8.7–10.2)
Chloride: 100 mmol/L (ref 96–106)
Creatinine, Ser: 0.92 mg/dL (ref 0.57–1.00)
Globulin, Total: 2.4 g/dL (ref 1.5–4.5)
Glucose: 92 mg/dL (ref 70–99)
Potassium: 4.3 mmol/L (ref 3.5–5.2)
Sodium: 136 mmol/L (ref 134–144)
Total Protein: 6.5 g/dL (ref 6.0–8.5)
eGFR: 78 mL/min/{1.73_m2} (ref >59)

## 2022-11-19 LAB — CBC
Hematocrit: 37.6 % (ref 34.0–46.6)
Hemoglobin: 11.7 g/dL (ref 11.1–15.9)
MCH: 25.3 pg — ABNORMAL LOW (ref 26.6–33.0)
MCHC: 31.1 g/dL — ABNORMAL LOW (ref 31.5–35.7)
MCV: 81 fL (ref 79–97)
Platelets: 257 10*3/uL (ref 150–450)
RBC: 4.62 x10E6/uL (ref 3.77–5.28)
RDW: 14.6 % (ref 11.7–15.4)
WBC: 4.1 10*3/uL (ref 3.4–10.8)

## 2022-11-19 LAB — HEMOGLOBIN A1C
Est. average glucose Bld gHb Est-mCnc: 123 mg/dL
Hgb A1c MFr Bld: 5.9 % — ABNORMAL HIGH (ref 4.8–5.6)

## 2022-11-19 LAB — VITAMIN D 25 HYDROXY (VIT D DEFICIENCY, FRACTURES): Vit D, 25-Hydroxy: 27.1 ng/mL — ABNORMAL LOW (ref 30.0–100.0)

## 2022-11-19 LAB — TSH: TSH: 1.41 u[IU]/mL (ref 0.450–4.500)

## 2022-11-27 ENCOUNTER — Encounter: Payer: Self-pay | Admitting: Family Medicine

## 2022-11-27 ENCOUNTER — Ambulatory Visit (INDEPENDENT_AMBULATORY_CARE_PROVIDER_SITE_OTHER): Payer: 59 | Admitting: Family Medicine

## 2022-11-27 VITALS — BP 150/98 | HR 65 | Resp 16 | Ht 67.0 in | Wt 225.0 lb

## 2022-11-27 DIAGNOSIS — E785 Hyperlipidemia, unspecified: Secondary | ICD-10-CM

## 2022-11-27 DIAGNOSIS — D72819 Decreased white blood cell count, unspecified: Secondary | ICD-10-CM

## 2022-11-27 DIAGNOSIS — F329 Major depressive disorder, single episode, unspecified: Secondary | ICD-10-CM

## 2022-11-27 DIAGNOSIS — I10 Essential (primary) hypertension: Secondary | ICD-10-CM | POA: Diagnosis not present

## 2022-11-27 DIAGNOSIS — D539 Nutritional anemia, unspecified: Secondary | ICD-10-CM

## 2022-11-27 DIAGNOSIS — R7302 Impaired glucose tolerance (oral): Secondary | ICD-10-CM

## 2022-11-27 DIAGNOSIS — E669 Obesity, unspecified: Secondary | ICD-10-CM

## 2022-11-27 DIAGNOSIS — F411 Generalized anxiety disorder: Secondary | ICD-10-CM

## 2022-11-27 MED ORDER — AMLODIPINE BESYLATE 10 MG PO TABS: 10.0000 mg | ORAL_TABLET | Freq: Every day | ORAL | 3 refills | Status: DC

## 2022-11-27 MED ORDER — AMLODIPINE BESYLATE 5 MG PO TABS: 5.0000 mg | ORAL_TABLET | Freq: Every day | ORAL | 1 refills | Status: DC

## 2022-11-27 MED ORDER — FLUTICASONE PROPIONATE 50 MCG/ACT NA SUSP: 2.0000 | Freq: Every day | NASAL | 6 refills | Status: AC

## 2022-11-27 MED ORDER — VITAMIN D (ERGOCALCIFEROL) 1.25 MG (50000 UNIT) PO CAPS: 50000.0000 [IU] | ORAL_CAPSULE | ORAL | 3 refills | Status: AC

## 2022-11-27 NOTE — Patient Instructions (Addendum)
F/U mid November, call if you need me sooner  All the best , and Congratulations  To get to point B from A we have to make changes    New dose of amlodipine is 10 mg daily, today take an additional  5 mg tab;et , start 10 mg once daily every evening at 9 pm  Plan and follow eating plan  HBA1C, cBc, iron and ferritin , chem 7 and EGFr and  lipids fasting 3 to 5 days before next visit  Start OTC iron 325 mg one tablet once daily  It is important that you exercise regularly at least 30 minutes 5 times a week. If you develop chest pain, have severe difficulty breathing, or feel very tired, stop exercising immediately and seek medical attention  Thanks for choosing [facility], we consider it a privelige to serve you.

## 2022-11-29 ENCOUNTER — Encounter: Payer: Self-pay | Admitting: Family Medicine

## 2022-11-29 DIAGNOSIS — F411 Generalized anxiety disorder: Secondary | ICD-10-CM | POA: Insufficient documentation

## 2022-11-29 DIAGNOSIS — R7302 Impaired glucose tolerance (oral): Secondary | ICD-10-CM | POA: Insufficient documentation

## 2022-11-29 NOTE — Assessment & Plan Note (Signed)
Noit at goal, increase amlodipine to 10 mg and re eval in 8 to 10 weeks DASH diet and commitment to daily physical activity for a minimum of 30 minutes discussed and encouraged, as a part of hypertension management. The importance of attaining a healthy weight is also discussed.     11/27/2022    2:52 PM 11/27/2022    2:12 PM 06/13/2022   10:06 AM 06/13/2022    9:16 AM 12/28/2021   10:47 AM 09/03/2021    8:50 AM 08/10/2021    2:36 PM  BP/Weight  Systolic BP 150 138 146 141 134 120 142  Diastolic BP 98 88 94 90 89 70 98  Wt. (Lbs)  225  220.08 220 227.4 227.4  BMI  35.24 kg/m2  34.47 kg/m2 34.46 kg/m2 35.62 kg/m2 35.62 kg/m2

## 2022-11-29 NOTE — Assessment & Plan Note (Signed)
Patient educated about the importance of limiting  Carbohydrate intake , the need to commit to daily physical activity for a minimum of 30 minutes , and to commit weight loss. The fact that changes in all these areas will reduce or eliminate all together the development of diabetes is stressed.      Latest Ref Rng & Units 11/18/2022   11:12 AM 12/20/2021   10:20 AM 07/25/2021    8:53 AM 12/26/2020    2:01 PM 11/23/2020    6:50 PM  Diabetic Labs  HbA1c 4.8 - 5.6 % 5.9   5.6  5.5    Chol 100 - 199 mg/dL 595  638  756  433    HDL >39 mg/dL 61  52  68  60    Calc LDL 0 - 99 mg/dL 295  188  416  606    Triglycerides 0 - 149 mg/dL 301  601  093  72    Creatinine 0.57 - 1.00 mg/dL 2.35  5.73  2.20  2.54  0.89       11/27/2022    2:52 PM 11/27/2022    2:12 PM 06/13/2022   10:06 AM 06/13/2022    9:16 AM 12/28/2021   10:47 AM 09/03/2021    8:50 AM 08/10/2021    2:36 PM  BP/Weight  Systolic BP 150 138 146 141 134 120 142  Diastolic BP 98 88 94 90 89 70 98  Wt. (Lbs)  225  220.08 220 227.4 227.4  BMI  35.24 kg/m2  34.47 kg/m2 34.46 kg/m2 35.62 kg/m2 35.62 kg/m2       No data to display          Updated lab needed at/ before next visit.

## 2022-11-29 NOTE — Assessment & Plan Note (Signed)
Encouraged pt to continue therapy

## 2022-11-29 NOTE — Assessment & Plan Note (Signed)
  Patient re-educated about  the importance of commitment to a  minimum of 150 minutes of exercise per week as able.  The importance of healthy food choices with portion control discussed, as well as eating regularly and within a 12 hour window most days. The need to choose "clean , green" food 50 to 75% of the time is discussed, as well as to make water the primary drink and set a goal of 64 ounces water daily.       11/27/2022    2:12 PM 06/13/2022    9:16 AM 12/28/2021   10:47 AM  Weight /BMI  Weight 225 lb 220 lb 1.3 oz 220 lb  Height 5\' 7"  (1.702 m) 5\' 7"  (1.702 m) 5\' 7"  (1.702 m)  BMI 35.24 kg/m2 34.47 kg/m2 34.46 kg/m2

## 2022-11-29 NOTE — Progress Notes (Signed)
Kendra Wong     MRN: [id number]      DOB: 10-24-1975  Chief Complaint  Patient presents with   Hypertension    Follow up visit     HPI [name] is here for follow up and re-evaluation of chronic medical conditions, medication management and review of any available recent lab and radiology data.  Preventive health is updated, specifically  Cancer screening and Immunization.   Has had a few therapy sessions and intends to continue, found them to be useful Increased work demands with new job, recent promotion, lack of staffing and professional educational requirements, somewhat overwhelmed, discussed cutting back anything she can at this time, and making time for 20 to 30 mins exercise daily Increased unhealthy snacking with weight gainm    ROS Denies recent fever or chills. Denies sinus pressure, nasal congestion, ear pain or sore throat. Denies chest congestion, productive cough or wheezing. Denies chest pains, palpitations and leg swelling Denies abdominal pain, nausea, vomiting,diarrhea or constipation.   Denies dysuria, frequency, hesitancy or incontinence. Denies joint pain, swelling and limitation in mobility. Denies headaches, seizures, numbness, or tingling.  Denies skin break down or rash.   PE  BP (!) 150/98   Pulse 65   Resp 16   Ht 5\' 7"  (1.702 m)   Wt 225 lb (102.1 kg)   SpO2 96%   BMI 35.24 kg/m   Patient alert and oriented and in no cardiopulmonary distress.  HEENT: No facial asymmetry, EOMI,     Neck supple .  Chest: Clear to auscultation bilaterally.  CVS: S1, S2 no murmurs, no S3.Regular rate.  ABD: Soft non tender.   Ext: No edema  MS: Adequate ROM spine, shoulders, hips and knees.  Skin: Intact, no ulcerations or rash noted.  Psych: Good eye contact, normal affect. Memory intact not anxious or depressed appearing.  CNS: CN 2-12 intact, power,  normal throughout.no focal deficits noted.   Assessment & Plan  Essential  hypertension Noit at goal, increase amlodipine to 10 mg and re eval in 8 to 10 weeks DASH diet and commitment to daily physical activity for a minimum of 30 minutes discussed and encouraged, as a part of hypertension management. The importance of attaining a healthy weight is also discussed.     11/27/2022    2:52 PM 11/27/2022    2:12 PM 06/13/2022   10:06 AM 06/13/2022    9:16 AM 12/28/2021   10:47 AM 09/03/2021    8:50 AM 08/10/2021    2:36 PM  BP/Weight  Systolic BP 150 138 146 141 134 120 142  Diastolic BP 98 88 94 90 89 70 98  Wt. (Lbs)  225  220.08 220 227.4 227.4  BMI  35.24 kg/m2  34.47 kg/m2 34.46 kg/m2 35.62 kg/m2 35.62 kg/m2       Obesity (BMI 30.0-34.9)  Patient re-educated about  the importance of commitment to a  minimum of 150 minutes of exercise per week as able.  The importance of healthy food choices with portion control discussed, as well as eating regularly and within a 12 hour window most days. The need to choose "clean , green" food 50 to 75% of the time is discussed, as well as to make water the primary drink and set a goal of 64 ounces water daily.       11/27/2022    2:12 PM 06/13/2022    9:16 AM 12/28/2021   10:47 AM  Weight /BMI  Weight 225 lb 220  lb 1.3 oz 220 lb  Height 5\' 7"  (1.702 m) 5\' 7"  (1.702 m) 5\' 7"  (1.702 m)  BMI 35.24 kg/m2 34.47 kg/m2 34.46 kg/m2      Hyperlipidemia LDL goal <100 Hyperlipidemia:Low fat diet discussed and encouraged.   Lipid Panel  Lab Results  Component Value Date   CHOL 219 (H) 11/18/2022   HDL 61 11/18/2022   LDLCALC 137 (H) 11/18/2022   TRIG 116 11/18/2022   CHOLHDL 3.6 11/18/2022     Needs to reduce fried and fatty foods  Depression Encouraged pt to continue therapy  GAD (generalized anxiety disorder) Behavior modification and therapy recommended  IGT (impaired glucose tolerance) Patient educated about the importance of limiting  Carbohydrate intake , the need to commit to daily physical activity for  a minimum of 30 minutes , and to commit weight loss. The fact that changes in all these areas will reduce or eliminate all together the development of diabetes is stressed.      Latest Ref Rng & Units 11/18/2022   11:12 AM 12/20/2021   10:20 AM 07/25/2021    8:53 AM 12/26/2020    2:01 PM 11/23/2020    6:50 PM  Diabetic Labs  HbA1c 4.8 - 5.6 % 5.9   5.6  5.5    Chol 100 - 199 mg/dL 409  811  914  782    HDL >39 mg/dL 61  52  68  60    Calc LDL 0 - 99 mg/dL 956  213  086  578    Triglycerides 0 - 149 mg/dL 469  629  528  72    Creatinine 0.57 - 1.00 mg/dL 4.13  2.44  0.10  2.72  0.89       11/27/2022    2:52 PM 11/27/2022    2:12 PM 06/13/2022   10:06 AM 06/13/2022    9:16 AM 12/28/2021   10:47 AM 09/03/2021    8:50 AM 08/10/2021    2:36 PM  BP/Weight  Systolic BP 150 138 146 141 134 120 142  Diastolic BP 98 88 94 90 89 70 98  Wt. (Lbs)  225  220.08 220 227.4 227.4  BMI  35.24 kg/m2  34.47 kg/m2 34.46 kg/m2 35.62 kg/m2 35.62 kg/m2       No data to display          Updated lab needed at/ before next visit.

## 2022-11-29 NOTE — Assessment & Plan Note (Signed)
Hyperlipidemia:Low fat diet discussed and encouraged.   Lipid Panel  Lab Results  Component Value Date   CHOL 219 (H) 11/18/2022   HDL 61 11/18/2022   LDLCALC 137 (H) 11/18/2022   TRIG 116 11/18/2022   CHOLHDL 3.6 11/18/2022     Needs to reduce fried and fatty foods

## 2022-11-29 NOTE — Assessment & Plan Note (Signed)
Behavior modification and therapy recommended

## 2022-12-02 ENCOUNTER — Ambulatory Visit (HOSPITAL_COMMUNITY): Payer: 59 | Admitting: Psychiatry

## 2022-12-16 ENCOUNTER — Ambulatory Visit (HOSPITAL_COMMUNITY): Payer: 59 | Admitting: Psychiatry

## 2022-12-16 ENCOUNTER — Telehealth (HOSPITAL_COMMUNITY): Payer: Self-pay | Admitting: Psychiatry

## 2022-12-16 NOTE — Telephone Encounter (Signed)
Therapist called patient via phone for scheduled in office appointment.  Patient reports being unable to keep appointment as she had forgotten about the appointment and currently is at work.  Patient will call and schedule another appointment.  Therapist reminded patient of no-show policy and informed  patient to call 24 hours in advance should she find it necessary to cancel future appointments to avoid possible termination.

## 2023-03-13 ENCOUNTER — Ambulatory Visit (INDEPENDENT_AMBULATORY_CARE_PROVIDER_SITE_OTHER): Payer: Self-pay | Admitting: Family Medicine

## 2023-03-13 ENCOUNTER — Encounter: Payer: Self-pay | Admitting: Family Medicine

## 2023-03-13 VITALS — BP 130/90 | HR 79 | Ht 67.0 in | Wt 233.0 lb

## 2023-03-13 DIAGNOSIS — I10 Essential (primary) hypertension: Secondary | ICD-10-CM

## 2023-03-13 DIAGNOSIS — E785 Hyperlipidemia, unspecified: Secondary | ICD-10-CM

## 2023-03-13 DIAGNOSIS — R7302 Impaired glucose tolerance (oral): Secondary | ICD-10-CM

## 2023-03-13 DIAGNOSIS — E66811 Obesity, class 1: Secondary | ICD-10-CM

## 2023-03-13 DIAGNOSIS — F411 Generalized anxiety disorder: Secondary | ICD-10-CM

## 2023-03-13 DIAGNOSIS — Z2821 Immunization not carried out because of patient refusal: Secondary | ICD-10-CM

## 2023-03-13 MED ORDER — AMLODIPINE BESYLATE 10 MG PO TABS: 10.0000 mg | ORAL_TABLET | Freq: Every day | ORAL | 3 refills | Status: DC

## 2023-03-13 NOTE — Patient Instructions (Addendum)
Annual exam in 14 weeks, call if you need me sooner  Blood pressure improved but still not at goal of less than 130/80  Check insurance to see if Wegovy or Zepbound is covered for weight los, these medications have been very beneficial  Nurse pls add lipid panel to labs this morning   I will message you with results  It is important that you exercise regularly at least 30 minutes 5 times a week. If you develop chest pain, have severe difficulty breathing, or feel very tired, stop exercising immediately and seek medical attention   Think about what you will eat, plan ahead. Choose " clean, green, fresh or frozen" over canned, processed or packaged foods which are more sugary, salty and fatty. 70 to 75% of food eaten should be vegetables and fruit. Three meals at set times with snacks allowed between meals, but they must be fruit or vegetables. Aim to eat over a 12 hour period , example 7 am to 7 pm, and STOP after  your last meal of the day. Drink water,generally about 64 ounces per day, no other drink is as healthy. Fruit juice is best enjoyed in a healthy way, by EATING the fruit.   Best for 2025!

## 2023-03-15 LAB — CBC WITH DIFFERENTIAL/PLATELET
Basophils Absolute: 0 10*3/uL (ref 0.0–0.2)
Basos: 1 %
EOS (ABSOLUTE): 0.1 10*3/uL (ref 0.0–0.4)
Eos: 2 %
Hematocrit: 41 % (ref 34.0–46.6)
Hemoglobin: 12.7 g/dL (ref 11.1–15.9)
Immature Grans (Abs): 0 10*3/uL (ref 0.0–0.1)
Immature Granulocytes: 0 %
Lymphocytes Absolute: 1.4 10*3/uL (ref 0.7–3.1)
Lymphs: 33 %
MCH: 25.9 pg — ABNORMAL LOW (ref 26.6–33.0)
MCHC: 31 g/dL — ABNORMAL LOW (ref 31.5–35.7)
MCV: 84 fL (ref 79–97)
Monocytes Absolute: 0.6 10*3/uL (ref 0.1–0.9)
Monocytes: 14 %
Neutrophils Absolute: 2.2 10*3/uL (ref 1.4–7.0)
Neutrophils: 50 %
Platelets: 253 10*3/uL (ref 150–450)
RBC: 4.9 x10E6/uL (ref 3.77–5.28)
RDW: 14.6 % (ref 11.7–15.4)
WBC: 4.3 10*3/uL (ref 3.4–10.8)

## 2023-03-15 LAB — BMP8+EGFR
BUN/Creatinine Ratio: 13 (ref 9–23)
BUN: 12 mg/dL (ref 6–24)
CO2: 24 mmol/L (ref 20–29)
Calcium: 8.9 mg/dL (ref 8.7–10.2)
Chloride: 100 mmol/L (ref 96–106)
Creatinine, Ser: 0.89 mg/dL (ref 0.57–1.00)
Glucose: 98 mg/dL (ref 70–99)
Potassium: 4.6 mmol/L (ref 3.5–5.2)
Sodium: 139 mmol/L (ref 134–144)
eGFR: 80 mL/min/{1.73_m2} (ref >59)

## 2023-03-15 LAB — LIPID PANEL
Chol/HDL Ratio: 4 {ratio} (ref 0.0–4.4)
Cholesterol, Total: 231 mg/dL — ABNORMAL HIGH (ref 100–199)
HDL: 58 mg/dL (ref >39)
LDL Chol Calc (NIH): 153 mg/dL — ABNORMAL HIGH (ref 0–99)
Triglycerides: 114 mg/dL (ref 0–149)
VLDL Cholesterol Cal: 20 mg/dL (ref 5–40)

## 2023-03-15 LAB — FERRITIN: Ferritin: 20 ng/mL (ref 15–150)

## 2023-03-15 LAB — HEMOGLOBIN A1C
Est. average glucose Bld gHb Est-mCnc: 120 mg/dL
Hgb A1c MFr Bld: 5.8 % — ABNORMAL HIGH (ref 4.8–5.6)

## 2023-03-15 LAB — IRON: Iron: 82 ug/dL (ref 27–159)

## 2023-03-17 NOTE — Progress Notes (Signed)
Kendra Wong     MRN: [id number]      DOB: [birth year]  Chief Complaint  Patient presents with   Follow-up    Follow up irregular periods     HPI Kendra Wong is here for follow up and re-evaluation of chronic medical conditions, medication management and review of any available recent lab and radiology data.  Preventive health is updated, specifically  Cancer screening and Immunization.   Questions or concerns regarding consultations or procedures which the PT has had in the interim are  addressed. The PT denies any adverse reactions to current medications since the last visit.  There are no new concerns.  There are no specific complaints   ROS Denies recent fever or chills. Denies sinus pressure, nasal congestion, ear pain or sore throat. Denies chest congestion, productive cough or wheezing. Denies chest pains, palpitations and leg swelling Denies abdominal pain, nausea, vomiting,diarrhea or constipation.   Denies dysuria, frequency, hesitancy or incontinence. Denies joint pain, swelling and limitation in mobility. Denies headaches, seizures, numbness, or tingling. Denies depression, ongoing work stress which has improved, however eating habits remain poor Denies skin break down or rash.   PE  BP (!) 130/90   Pulse 79   Ht 5\' 7"  (1.702 m)   Wt 233 lb 0.6 oz (105.7 kg)   SpO2 95%   BMI 36.50 kg/m   Patient alert and oriented and in no cardiopulmonary distress.  HEENT: No facial asymmetry, EOMI,     Neck supple .  Chest: Clear to auscultation bilaterally.  CVS: S1, S2 no murmurs, no S3.Regular rate.  ABD: Soft non tender.   Ext: No edema  MS: Adequate ROM spine, shoulders, hips and knees.  Skin: Intact, no ulcerations or rash noted.  Psych: Good eye contact, normal affect. Memory intact not anxious or depressed appearing.  CNS: CN 2-12 intact, power,  normal throughout.no focal deficits noted.   Assessment & Plan  Essential hypertension DASH diet and  commitment to daily physical activity for a minimum of 30 minutes discussed and encouraged, as a part of hypertension management. The importance of attaining a healthy weight is also discussed. Still not at goal, hoping that weiht loss will get pt to goal as well as regular exercise     03/13/2023    3:42 PM 03/13/2023    3:12 PM 11/27/2022    2:52 PM 11/27/2022    2:12 PM 06/13/2022   10:06 AM 06/13/2022    9:16 AM 12/28/2021   10:47 AM  BP/Weight  Systolic BP 130 128 150 138 146 141 134  Diastolic BP 90 83 98 88 94 90 89  Wt. (Lbs)  233.04  225  220.08 220  BMI  36.5 kg/m2  35.24 kg/m2  34.47 kg/m2 34.46 kg/m2       IGT (impaired glucose tolerance) Patient educated about the importance of limiting  Carbohydrate intake , the need to commit to daily physical activity for a minimum of 30 minutes , and to commit weight loss. The fact that changes in all these areas will reduce or eliminate all together the development of diabetes is stressed.  Slight improvement , she is applauded on this     Latest Ref Rng & Units 03/13/2023    8:24 AM 11/18/2022   11:12 AM 12/20/2021   10:20 AM 07/25/2021    8:53 AM 12/26/2020    2:01 PM  Diabetic Labs  HbA1c 4.8 - 5.6 % 5.8  5.9   5.6  5.5   Chol 100 - 199 mg/dL 161  096  045  409  811   HDL >39 mg/dL 58  61  52  68  60   Calc LDL 0 - 99 mg/dL 914  782  956  213  086   Triglycerides 0 - 149 mg/dL 578  469  629  528  72   Creatinine 0.57 - 1.00 mg/dL 4.13  2.44  0.10  2.72  0.74       03/13/2023    3:42 PM 03/13/2023    3:12 PM 11/27/2022    2:52 PM 11/27/2022    2:12 PM 06/13/2022   10:06 AM 06/13/2022    9:16 AM 12/28/2021   10:47 AM  BP/Weight  Systolic BP 130 128 150 138 146 141 134  Diastolic BP 90 83 98 88 94 90 89  Wt. (Lbs)  233.04  225  220.08 220  BMI  36.5 kg/m2  35.24 kg/m2  34.47 kg/m2 34.46 kg/m2       No data to display            Refused influenza vaccine Offered and continues to refuse vaccine  Hyperlipidemia LDL  goal <100 Hyperlipidemia:Low fat diet discussed and encouraged.   Lipid Panel  Lab Results  Component Value Date   CHOL 231 (H) 03/13/2023   HDL 58 03/13/2023   LDLCALC 153 (H) 03/13/2023   TRIG 114 03/13/2023   CHOLHDL 4.0 03/13/2023     Markedly elevated , needs to work on food choice  Obesity (BMI 30.0-34.9)  Patient re-educated about  the importance of commitment to a  minimum of 150 minutes of exercise per week as able.  The importance of healthy food choices with portion control discussed, as well as eating regularly and within a 12 hour window most days. The need to choose "clean , green" food 50 to 75% of the time is discussed, as well as to make water the primary drink and set a goal of 64 ounces water daily.       03/13/2023    3:12 PM 11/27/2022    2:12 PM 06/13/2022    9:16 AM  Weight /BMI  Weight 233 lb 0.6 oz 225 lb 220 lb 1.3 oz  Height 5\' 7"  (1.702 m) 5\' 7"  (1.702 m) 5\' 7"  (1.702 m)  BMI 36.5 kg/m2 35.24 kg/m2 34.47 kg/m2    Deteriorted , 13 pound weight gain in 9 monhts, reports stress eating   GAD (generalized anxiety disorder) Recommended that she continue therapy sessions. Anxiety and stress continue and she is compensating by eating excessively/ uncontrolled manner

## 2023-03-17 NOTE — Assessment & Plan Note (Addendum)
DASH diet and commitment to daily physical activity for a minimum of 30 minutes discussed and encouraged, as a part of hypertension management. The importance of attaining a healthy weight is also discussed. Still not at goal, hoping that weiht loss will get pt to goal as well as regular exercise     03/13/2023    3:42 PM 03/13/2023    3:12 PM 11/27/2022    2:52 PM 11/27/2022    2:12 PM 06/13/2022   10:06 AM 06/13/2022    9:16 AM 12/28/2021   10:47 AM  BP/Weight  Systolic BP 130 128 150 138 146 141 134  Diastolic BP 90 83 98 88 94 90 89  Wt. (Lbs)  233.04  225  220.08 220  BMI  36.5 kg/m2  35.24 kg/m2  34.47 kg/m2 34.46 kg/m2

## 2023-03-17 NOTE — Assessment & Plan Note (Signed)
Hyperlipidemia:Low fat diet discussed and encouraged.   Lipid Panel  Lab Results  Component Value Date   CHOL 231 (H) 03/13/2023   HDL 58 03/13/2023   LDLCALC 153 (H) 03/13/2023   TRIG 114 03/13/2023   CHOLHDL 4.0 03/13/2023     Markedly elevated , needs to work on food choice

## 2023-03-17 NOTE — Assessment & Plan Note (Signed)
  Patient re-educated about  the importance of commitment to a  minimum of 150 minutes of exercise per week as able.  The importance of healthy food choices with portion control discussed, as well as eating regularly and within a 12 hour window most days. The need to choose "clean , green" food 50 to 75% of the time is discussed, as well as to make water the primary drink and set a goal of 64 ounces water daily.       03/13/2023    3:12 PM 11/27/2022    2:12 PM 06/13/2022    9:16 AM  Weight /BMI  Weight 233 lb 0.6 oz 225 lb 220 lb 1.3 oz  Height 5\' 7"  (1.702 m) 5\' 7"  (1.702 m) 5\' 7"  (1.702 m)  BMI 36.5 kg/m2 35.24 kg/m2 34.47 kg/m2    Deteriorted , 13 pound weight gain in 9 monhts, reports stress eating

## 2023-03-17 NOTE — Assessment & Plan Note (Signed)
Offered and continues to refuse vaccine

## 2023-03-17 NOTE — Assessment & Plan Note (Signed)
Recommended that she continue therapy sessions. Anxiety and stress continue and she is compensating by eating excessively/ uncontrolled manner

## 2023-03-17 NOTE — Assessment & Plan Note (Signed)
Patient educated about the importance of limiting  Carbohydrate intake , the need to commit to daily physical activity for a minimum of 30 minutes , and to commit weight loss. The fact that changes in all these areas will reduce or eliminate all together the development of diabetes is stressed.  Slight improvement , she is applauded on this     Latest Ref Rng & Units 03/13/2023    8:24 AM 11/18/2022   11:12 AM 12/20/2021   10:20 AM 07/25/2021    8:53 AM 12/26/2020    2:01 PM  Diabetic Labs  HbA1c 4.8 - 5.6 % 5.8  5.9   5.6  5.5   Chol 100 - 199 mg/dL 161  096  045  409  811   HDL >39 mg/dL 58  61  52  68  60   Calc LDL 0 - 99 mg/dL 914  782  956  213  086   Triglycerides 0 - 149 mg/dL 578  469  629  528  72   Creatinine 0.57 - 1.00 mg/dL 4.13  2.44  0.10  2.72  0.74       03/13/2023    3:42 PM 03/13/2023    3:12 PM 11/27/2022    2:52 PM 11/27/2022    2:12 PM 06/13/2022   10:06 AM 06/13/2022    9:16 AM 12/28/2021   10:47 AM  BP/Weight  Systolic BP 130 128 150 138 146 141 134  Diastolic BP 90 83 98 88 94 90 89  Wt. (Lbs)  233.04  225  220.08 220  BMI  36.5 kg/m2  35.24 kg/m2  34.47 kg/m2 34.46 kg/m2       No data to display

## 2023-05-06 ENCOUNTER — Telehealth: Payer: Self-pay

## 2023-05-06 NOTE — Telephone Encounter (Signed)
 Needs appt with first available in office or Urgent care if none available      Copied from CRM [id number]. Topic: Appointments - Scheduling Inquiry for Clinic >> May 06, 2023  1:36 PM Kendra Wong wrote: Reason for CRM: pt hurt hand wants to know if appt is best needed

## 2023-05-07 NOTE — Telephone Encounter (Signed)
 LVM to call and schedule appt if needed

## 2023-05-19 ENCOUNTER — Encounter: Payer: Self-pay | Admitting: Family Medicine

## 2023-05-20 ENCOUNTER — Encounter: Payer: Self-pay | Admitting: Family Medicine

## 2023-05-20 ENCOUNTER — Ambulatory Visit (INDEPENDENT_AMBULATORY_CARE_PROVIDER_SITE_OTHER): Payer: 59 | Admitting: Family Medicine

## 2023-05-20 VITALS — BP 103/73 | HR 87 | Ht 67.0 in | Wt 240.1 lb

## 2023-05-20 DIAGNOSIS — E785 Hyperlipidemia, unspecified: Secondary | ICD-10-CM

## 2023-05-20 DIAGNOSIS — I10 Essential (primary) hypertension: Secondary | ICD-10-CM

## 2023-05-20 DIAGNOSIS — M778 Other enthesopathies, not elsewhere classified: Secondary | ICD-10-CM

## 2023-05-20 DIAGNOSIS — R7302 Impaired glucose tolerance (oral): Secondary | ICD-10-CM

## 2023-05-20 DIAGNOSIS — M1712 Unilateral primary osteoarthritis, left knee: Secondary | ICD-10-CM

## 2023-05-20 MED ORDER — MELOXICAM 15 MG PO TABS: ORAL_TABLET | ORAL | 0 refills | Status: DC

## 2023-05-20 MED ORDER — PREDNISONE 10 MG PO TABS: 10.0000 mg | ORAL_TABLET | Freq: Two times a day (BID) | ORAL | 0 refills | Status: DC

## 2023-05-20 NOTE — Patient Instructions (Signed)
Follow-up as before, call if you need me sooner.  5-day course of prednisone is prescribed for tendinitis of the right wrist and arthritic pain in the left knee.  Following the prednisone if this is beneficial you may fill the prescription for meloxicam and take the anti-inflammatory for an additional 1 week and then as needed.  If you get no significant response with the oral medication please do send me a message I will refer you to orthopedics for management of your joint problems.  Good that you are going to start exercise in the near future, unfortunately right now with your knee acutely inflamed inflamed this will limit what you are able to do please do not overuse daily until they current acute pain has resolved.  Bike riding will be an more reasonable exercise to start with and then you can transition to weightbearing exercise.  Please send the name of the medication you want to use for weight loss the options are Wegovy and Zepbound.  If your insurance covers neither then phentermine tablet is an affordable option.  Please get fasting lipid panel CMP and EGFR and HbA1c 3 to 5 days before your February appointment  Thanks for choosing [facility], we consider it a privelige to serve you.

## 2023-05-20 NOTE — Progress Notes (Signed)
 Kendra Wong     MRN: [id number]      DOB: 1975/07/09  Chief Complaint  Patient presents with   Follow-up    L leg pain and r wrist pain     HPI Kendra Wong is here for follow up and re-evaluation of chronic medical conditions, medication management and review of any available recent lab and radiology data.  Preventive health is updated, specifically  Cancer screening and Immunization.   Questions or concerns regarding consultations or procedures which the PT has had in the interim are  addressed. The PT denies any adverse reactions to current medications since the last visit.  Used palm of right hand to open door on 12/16 at her job and since then has had localized pain and over thumb base which also limits movement. Using splint, no meds have been used, awakes in pain with a 6 plus  C/o left  knee pain limiting activity at times  ROS Denies recent fever or chills. Denies sinus pressure, nasal congestion, ear pain or sore throat. Denies chest congestion, productive cough or wheezing. Denies chest pains, palpitations and leg swelling Denies abdominal pain, nausea, vomiting,diarrhea or constipation.   Denies dysuria, frequency, hesitancy or incontinence.  Denies headaches, seizures, numbness, or tingling. Denies depression, anxiety or insomnia. Denies skin break down or rash.   PE  BP 103/73 (BP Location: Right Arm, Patient Position: Sitting, Cuff Size: Large)   Pulse 87   Ht 5\' 7"  (1.702 m)   Wt 240 lb 1.3 oz (108.9 kg)   SpO2 96%   BMI 37.60 kg/m   Patient alert and oriented and in no cardiopulmonary distress.  HEENT: No facial asymmetry, EOMI,     Neck supple .  Chest: Clear to auscultation bilaterally.  CVS: S1, S2 no murmurs, no S3.Regular rate.  ABD: Soft non tender.   Ext: No edema  MS: Adequate ROM spine, shoulders, hips and reduced in left knee tender over right wrist with limitation in movement Skin: Intact, no ulcerations or rash noted.  Psych:  Good eye contact, normal affect. Memory intact not anxious or depressed appearing.  CNS: CN 2-12 intact, power,  normal throughout.no focal deficits noted.   Assessment & Plan Tendonitis of wrist, right Ortho management indicated based on symptom duration and severity, opts to try oral meds,prednisone x 5 days, then meloxicam for up to  2weeks, will call for referral if needed  Osteoarthritis of left knee Increasing pain and selling, oral anti inflammatories and weight losswill benefit from Ortho,opts to hold off  Essential hypertension DASH diet and commitment to daily physical activity for a minimum of 30 minutes discussed and encouraged, as a part of hypertension management. The importance of attaining a healthy weight is also discussed.     05/20/2023    3:25 PM 03/13/2023    3:42 PM 03/13/2023    3:12 PM 11/27/2022    2:52 PM 11/27/2022    2:12 PM 06/13/2022   10:06 AM 06/13/2022    9:16 AM  BP/Weight  Systolic BP 103 130 128 150 138 146 141  Diastolic BP 73 90 83 98 88 94 90  Wt. (Lbs) 240.08  233.04  225  220.08  BMI 37.6 kg/m2  36.5 kg/m2  35.24 kg/m2  34.47 kg/m2     Controlled, no change in medication   IGT (impaired glucose tolerance) Patient educated about the importance of limiting  Carbohydrate intake , the need to commit to daily physical activity for a minimum of  30 minutes , and to commit weight loss. The fact that changes in all these areas will reduce or eliminate all together the development of diabetes is stressed.      Latest Ref Rng & Units 03/13/2023    8:24 AM 11/18/2022   11:12 AM 12/20/2021   10:20 AM 07/25/2021    8:53 AM 12/26/2020    2:01 PM  Diabetic Labs  HbA1c 4.8 - 5.6 % 5.8  5.9   5.6  5.5   Chol 100 - 199 mg/dL 409  811  914  782  956   HDL >39 mg/dL 58  61  52  68  60   Calc LDL 0 - 99 mg/dL 213  086  578  469  629   Triglycerides 0 - 149 mg/dL 528  413  244  010  72   Creatinine 0.57 - 1.00 mg/dL 2.72  5.36  6.44  0.34  0.74        05/20/2023    3:25 PM 03/13/2023    3:42 PM 03/13/2023    3:12 PM 11/27/2022    2:52 PM 11/27/2022    2:12 PM 06/13/2022   10:06 AM 06/13/2022    9:16 AM  BP/Weight  Systolic BP 103 130 128 150 138 146 141  Diastolic BP 73 90 83 98 88 94 90  Wt. (Lbs) 240.08  233.04  225  220.08  BMI 37.6 kg/m2  36.5 kg/m2  35.24 kg/m2  34.47 kg/m2       No data to display          Updated lab needed at/ before next visit.   Morbid obesity (HCC)  Patient re-educated about  the importance of commitment to a  minimum of 150 minutes of exercise per week as able.  The importance of healthy food choices with portion control discussed, as well as eating regularly and within a 12 hour window most days. The need to choose "clean , green" food 50 to 75% of the time is discussed, as well as to make water the primary drink and set a goal of 64 ounces water daily.       05/20/2023    3:25 PM 03/13/2023    3:12 PM 11/27/2022    2:12 PM  Weight /BMI  Weight 240 lb 1.3 oz 233 lb 0.6 oz 225 lb  Height 5\' 7"  (1.702 m) 5\' 7"  (1.702 m) 5\' 7"  (1.702 m)  BMI 37.6 kg/m2 36.5 kg/m2 35.24 kg/m2    Deteriorated, 15 pound weight gain in 4 months, wil, seek to start wegovy or zepbound and work on lifestyle  Hyperlipidemia LDL goal <100 Hyperlipidemia:Low fat diet discussed and encouraged.   Lipid Panel  Lab Results  Component Value Date   CHOL 231 (H) 03/13/2023   HDL 58 03/13/2023   LDLCALC 153 (H) 03/13/2023   TRIG 114 03/13/2023   CHOLHDL 4.0 03/13/2023     Updated lab needed at/ before next visit.

## 2023-05-20 NOTE — Telephone Encounter (Signed)
scheduled

## 2023-05-21 ENCOUNTER — Encounter: Payer: Self-pay | Admitting: Family Medicine

## 2023-06-16 ENCOUNTER — Other Ambulatory Visit: Payer: Self-pay | Admitting: Family Medicine

## 2023-06-16 ENCOUNTER — Encounter: Payer: Self-pay | Admitting: Family Medicine

## 2023-06-16 DIAGNOSIS — G8929 Other chronic pain: Secondary | ICD-10-CM

## 2023-06-16 DIAGNOSIS — M79641 Pain in right hand: Secondary | ICD-10-CM

## 2023-06-17 DIAGNOSIS — M778 Other enthesopathies, not elsewhere classified: Secondary | ICD-10-CM | POA: Insufficient documentation

## 2023-06-17 DIAGNOSIS — M1712 Unilateral primary osteoarthritis, left knee: Secondary | ICD-10-CM | POA: Insufficient documentation

## 2023-06-17 NOTE — Assessment & Plan Note (Signed)
 DASH diet and commitment to daily physical activity for a minimum of 30 minutes discussed and encouraged, as a part of hypertension management. The importance of attaining a healthy weight is also discussed.     05/20/2023    3:25 PM 03/13/2023    3:42 PM 03/13/2023    3:12 PM 11/27/2022    2:52 PM 11/27/2022    2:12 PM 06/13/2022   10:06 AM 06/13/2022    9:16 AM  BP/Weight  Systolic BP 103 130 128 150 138 146 141  Diastolic BP 73 90 83 98 88 94 90  Wt. (Lbs) 240.08  233.04  225  220.08  BMI 37.6 kg/m2  36.5 kg/m2  35.24 kg/m2  34.47 kg/m2     Controlled, no change in medication

## 2023-06-17 NOTE — Assessment & Plan Note (Signed)
 Hyperlipidemia:Low fat diet discussed and encouraged.   Lipid Panel  Lab Results  Component Value Date   CHOL 231 (H) 03/13/2023   HDL 58 03/13/2023   LDLCALC 153 (H) 03/13/2023   TRIG 114 03/13/2023   CHOLHDL 4.0 03/13/2023     Updated lab needed at/ before next visit.

## 2023-06-17 NOTE — Assessment & Plan Note (Signed)
 Patient educated about the importance of limiting  Carbohydrate intake , the need to commit to daily physical activity for a minimum of 30 minutes , and to commit weight loss. The fact that changes in all these areas will reduce or eliminate all together the development of diabetes is stressed.      Latest Ref Rng & Units 03/13/2023    8:24 AM 11/18/2022   11:12 AM 12/20/2021   10:20 AM 07/25/2021    8:53 AM 12/26/2020    2:01 PM  Diabetic Labs  HbA1c 4.8 - 5.6 % 5.8  5.9   5.6  5.5   Chol 100 - 199 mg/dL 161  096  045  409  811   HDL >39 mg/dL 58  61  52  68  60   Calc LDL 0 - 99 mg/dL 914  782  956  213  086   Triglycerides 0 - 149 mg/dL 578  469  629  528  72   Creatinine 0.57 - 1.00 mg/dL 4.13  2.44  0.10  2.72  0.74       05/20/2023    3:25 PM 03/13/2023    3:42 PM 03/13/2023    3:12 PM 11/27/2022    2:52 PM 11/27/2022    2:12 PM 06/13/2022   10:06 AM 06/13/2022    9:16 AM  BP/Weight  Systolic BP 103 130 128 150 138 146 141  Diastolic BP 73 90 83 98 88 94 90  Wt. (Lbs) 240.08  233.04  225  220.08  BMI 37.6 kg/m2  36.5 kg/m2  35.24 kg/m2  34.47 kg/m2       No data to display          Updated lab needed at/ before next visit.

## 2023-06-17 NOTE — Assessment & Plan Note (Signed)
 Increasing pain and selling, oral anti inflammatories and weight losswill benefit from Ortho,opts to hold off

## 2023-06-17 NOTE — Assessment & Plan Note (Signed)
 Ortho management indicated based on symptom duration and severity, opts to try oral meds,prednisone x 5 days, then meloxicam for up to  2weeks, will call for referral if needed

## 2023-06-17 NOTE — Assessment & Plan Note (Signed)
  Patient re-educated about  the importance of commitment to a  minimum of 150 minutes of exercise per week as able.  The importance of healthy food choices with portion control discussed, as well as eating regularly and within a 12 hour window most days. The need to choose "clean , green" food 50 to 75% of the time is discussed, as well as to make water the primary drink and set a goal of 64 ounces water daily.       05/20/2023    3:25 PM 03/13/2023    3:12 PM 11/27/2022    2:12 PM  Weight /BMI  Weight 240 lb 1.3 oz 233 lb 0.6 oz 225 lb  Height 5\' 7"  (1.702 m) 5\' 7"  (1.702 m) 5\' 7"  (1.702 m)  BMI 37.6 kg/m2 36.5 kg/m2 35.24 kg/m2    Deteriorated, 15 pound weight gain in 4 months, wil, seek to start wegovy or zepbound and work on lifestyle

## 2023-06-20 ENCOUNTER — Encounter: Payer: Self-pay | Admitting: Family Medicine

## 2023-08-01 ENCOUNTER — Other Ambulatory Visit: Payer: Self-pay | Admitting: Family Medicine

## 2023-08-01 DIAGNOSIS — J3089 Other allergic rhinitis: Secondary | ICD-10-CM

## 2023-08-04 ENCOUNTER — Telehealth: Payer: Self-pay | Admitting: Family Medicine

## 2023-08-04 NOTE — Telephone Encounter (Unsigned)
 Copied from CRM [id number]. Topic: General - Other >> Aug 04, 2023  3:30 PM Emylou G wrote: Reason for CRM: Patient inquiring on approval w/ Wegovy? Can she get started?  Was discussed on her last appt?  Pls call her [phone]

## 2023-08-05 ENCOUNTER — Telehealth: Payer: Self-pay | Admitting: Pharmacy Technician

## 2023-08-05 ENCOUNTER — Other Ambulatory Visit (HOSPITAL_COMMUNITY): Payer: Self-pay

## 2023-08-05 MED ORDER — SEMAGLUTIDE-WEIGHT MANAGEMENT 0.25 MG/0.5ML ~~LOC~~ SOAJ: 0.2500 mg | SUBCUTANEOUS | 0 refills | Status: DC

## 2023-08-05 NOTE — Telephone Encounter (Signed)
 Pharmacy Patient Advocate Encounter   Received notification from CoverMyMeds that prior authorization for [facility] 0.25MG /0.5ML auto-injectors is required/requested.   Insurance verification completed.   The patient is insured through [facility] .   Per test claim: PA required; PA started via CoverMyMeds. KEY [id number] . Please see clinical question(s) below that I am not finding the answer to in her chart and advise.

## 2023-08-06 ENCOUNTER — Encounter: Payer: Self-pay | Admitting: Family Medicine

## 2023-08-07 NOTE — Telephone Encounter (Signed)
 She does not have any of the 3 qualifying conditions. We will let her know she does not qualify based on her insurance requirements

## 2023-08-08 ENCOUNTER — Other Ambulatory Visit (HOSPITAL_COMMUNITY): Payer: Self-pay

## 2023-10-07 ENCOUNTER — Encounter: Payer: Self-pay | Admitting: Family Medicine

## 2023-10-07 ENCOUNTER — Ambulatory Visit (INDEPENDENT_AMBULATORY_CARE_PROVIDER_SITE_OTHER): Payer: Self-pay | Admitting: Family Medicine

## 2023-10-07 VITALS — BP 134/90 | HR 83 | Resp 16 | Ht 67.0 in | Wt 237.0 lb

## 2023-10-07 DIAGNOSIS — E785 Hyperlipidemia, unspecified: Secondary | ICD-10-CM | POA: Diagnosis not present

## 2023-10-07 DIAGNOSIS — Z0000 Encounter for general adult medical examination without abnormal findings: Secondary | ICD-10-CM

## 2023-10-07 DIAGNOSIS — Z1231 Encounter for screening mammogram for malignant neoplasm of breast: Secondary | ICD-10-CM

## 2023-10-07 DIAGNOSIS — R0602 Shortness of breath: Secondary | ICD-10-CM | POA: Diagnosis not present

## 2023-10-07 DIAGNOSIS — I10 Essential (primary) hypertension: Secondary | ICD-10-CM | POA: Diagnosis not present

## 2023-10-07 DIAGNOSIS — Z0001 Encounter for general adult medical examination with abnormal findings: Secondary | ICD-10-CM

## 2023-10-07 LAB — HEMOGLOBIN A1C
Est. average glucose Bld gHb Est-mCnc: 114 mg/dL
Hgb A1c MFr Bld: 5.6 % (ref 4.8–5.6)

## 2023-10-07 LAB — CMP14+EGFR
ALT: 12 IU/L (ref 0–32)
AST: 21 IU/L (ref 0–40)
Albumin: 4.2 g/dL (ref 3.9–4.9)
Alkaline Phosphatase: 81 IU/L (ref 44–121)
BUN/Creatinine Ratio: 14 (ref 9–23)
BUN: 12 mg/dL (ref 6–24)
Bilirubin Total: 0.3 mg/dL (ref 0.0–1.2)
CO2: 19 mmol/L — ABNORMAL LOW (ref 20–29)
Calcium: 9.2 mg/dL (ref 8.7–10.2)
Chloride: 101 mmol/L (ref 96–106)
Creatinine, Ser: 0.85 mg/dL (ref 0.57–1.00)
Globulin, Total: 2.3 g/dL (ref 1.5–4.5)
Glucose: 103 mg/dL — ABNORMAL HIGH (ref 70–99)
Potassium: 4.6 mmol/L (ref 3.5–5.2)
Sodium: 137 mmol/L (ref 134–144)
Total Protein: 6.5 g/dL (ref 6.0–8.5)
eGFR: 85 mL/min/{1.73_m2} (ref >59)

## 2023-10-07 LAB — LIPID PANEL
Chol/HDL Ratio: 4 ratio (ref 0.0–4.4)
Cholesterol, Total: 222 mg/dL — ABNORMAL HIGH (ref 100–199)
HDL: 56 mg/dL (ref >39)
LDL Chol Calc (NIH): 142 mg/dL — ABNORMAL HIGH (ref 0–99)
Triglycerides: 133 mg/dL (ref 0–149)
VLDL Cholesterol Cal: 24 mg/dL (ref 5–40)

## 2023-10-07 LAB — EKG 12-LEAD

## 2023-10-07 MED ORDER — TRIAMTERENE-HCTZ 37.5-25 MG PO TABS: 1.0000 | ORAL_TABLET | Freq: Every day | ORAL | 5 refills | Status: AC

## 2023-10-07 NOTE — Patient Instructions (Addendum)
 MD follow up in 4 months Nurse bP check in 4 weeks  MAMMMO TO BE SCHEDULED AT CHECKOUT PLEASE,  early am asap  Please add Vit d and tSH to recent labs  EKG today does not show heart damage  New med for bP is triamterene /hydrochlorothiazide ( maxzide) STOP amlodipine  , take in the morning with small amount of food  You are being referred to Cardiology for evaluation of new shortness of breath and poor exercise tolerance in past 2 months   Thanks for choosing [facility], we consider it a privelige to serve you.

## 2023-10-07 NOTE — Assessment & Plan Note (Signed)
  Patient re-educated about  the importance of commitment to a  minimum of 150 minutes of exercise per week as able.  The importance of healthy food choices with portion control discussed, as well as eating regularly and within a 12 hour window most days. The need to choose "clean , green" food 50 to 75% of the time is discussed, as well as to make water the primary drink and set a goal of 64 ounces water daily.       10/07/2023    1:38 PM 05/20/2023    3:25 PM 03/13/2023    3:12 PM  Weight /BMI  Weight 237 lb 240 lb 1.3 oz 233 lb 0.6 oz  Height 5\' 7"  (1.702 m) 5\' 7"  (1.702 m) 5\' 7"  (1.702 m)  BMI 37.12 kg/m2 37.6 kg/m2 36.5 kg/m2

## 2023-10-07 NOTE — Assessment & Plan Note (Signed)
 2 month h/o reduced exercise tolerance and shortness of breath  EKG in office and cardiology eval

## 2023-10-07 NOTE — Assessment & Plan Note (Signed)
 Slight improvement Hyperlipidemia:Low fat diet discussed and encouraged.   Lipid Panel  Lab Results  Component Value Date   CHOL 222 (H) 10/06/2023   HDL 56 10/06/2023   LDLCALC 142 (H) 10/06/2023   TRIG 133 10/06/2023   CHOLHDL 4.0 10/06/2023

## 2023-10-07 NOTE — Assessment & Plan Note (Signed)

## 2023-10-07 NOTE — Assessment & Plan Note (Signed)
 Not at goal. Start maxzide , stop amlodipine  Refer card due to new SOB. EKG today

## 2023-10-13 ENCOUNTER — Ambulatory Visit (HOSPITAL_COMMUNITY)
Admission: RE | Admit: 2023-10-13 | Discharge: 2023-10-13 | Disposition: A | Discharge status: Home / Self Care | Source: Ambulatory Visit | Attending: Family Medicine | Admitting: Family Medicine

## 2023-10-13 DIAGNOSIS — Z1231 Encounter for screening mammogram for malignant neoplasm of breast: Secondary | ICD-10-CM | POA: Insufficient documentation

## 2023-10-14 ENCOUNTER — Encounter (INDEPENDENT_AMBULATORY_CARE_PROVIDER_SITE_OTHER): Payer: Self-pay | Admitting: Otolaryngology

## 2023-10-14 ENCOUNTER — Ambulatory Visit (INDEPENDENT_AMBULATORY_CARE_PROVIDER_SITE_OTHER): Admitting: Otolaryngology

## 2023-10-14 VITALS — BP 127/83 | HR 70 | Ht 67.0 in | Wt 237.0 lb

## 2023-10-14 DIAGNOSIS — H608X3 Other otitis externa, bilateral: Secondary | ICD-10-CM | POA: Diagnosis not present

## 2023-10-14 DIAGNOSIS — H6123 Impacted cerumen, bilateral: Secondary | ICD-10-CM | POA: Diagnosis not present

## 2023-10-14 MED ORDER — MOMETASONE FUROATE 0.1 % EX CREA: TOPICAL_CREAM | CUTANEOUS | 2 refills | Status: AC

## 2023-10-14 NOTE — Progress Notes (Signed)
 Patient ID: Kendra Wong, female   DOB: 08-13-1975, 48 y.o.   MRN: [id number]  Follow up:

## 2023-10-16 DIAGNOSIS — H608X3 Other otitis externa, bilateral: Secondary | ICD-10-CM | POA: Insufficient documentation

## 2023-10-16 DIAGNOSIS — H6123 Impacted cerumen, bilateral: Secondary | ICD-10-CM | POA: Insufficient documentation

## 2023-10-21 ENCOUNTER — Encounter: Payer: Self-pay | Admitting: Family Medicine

## 2023-10-21 NOTE — Progress Notes (Incomplete)
 Kendra Wong     MRN: [id number]      DOB: [birth year]  Chief Complaint  Patient presents with  . Annual Exam    Has been having bilateral ankle swelling and SOB with any activity for the past couple of months     HPI: Patient is in for annual physical exam. C/o poor exercise tolerance and SOB over last 2 to 3 months , also c/o ankle swelling Immunization is reviewed , and  updated if needed.   PE: BP (!) 134/90   Pulse 83   Resp 16   Ht 5' 7 (1.702 m)   Wt 237 lb (107.5 kg)   SpO2 93%   BMI 37.12 kg/m   Pleasant  female, alert and oriented x 3, in no cardio-pulmonary distress. Afebrile. HEENT No facial trauma or asymetry. Sinuses non tender.  Extra occullar muscles intact.. External ears normal, . Neck: supple, no adenopathy,JVD or thyromegaly.No bruits.  Chest: Clear to ascultation bilaterally.No crackles or wheezes. Non tender to palpation  Breast: Asymptomatic an d mammogram upcoming , not examined  Cardiovascular system; Heart sounds normal,  S1 and  S2 ,no S3.  No murmur, or thrill. Apical beat not displaced Peripheral pulses normal.  Abdomen: Soft, non tender  GU: Asymptomatic, not examines papis UTD  Musculoskeletal exam: Full ROM of spine, hips , shoulders and knees. No deformity ,swelling noted. No muscle wasting or atrophy.   Neurologic: Cranial nerves 2 to 12 intact. Power, tone ,sensation normal throughout. No disturbance in gait. No tremor.  Skin: Intact, no ulceration, erythema , scaling or rash noted. Pigmentation normal throughout  Psych; Normal mood and affect. Judgement and concentration normal   Assessment & Plan:  Annual visit for general adult medical examination with abnormal findings Annual exam as documented. Counseling done  re healthy lifestyle involving commitment to 150 minutes exercise per week, heart healthy diet, and attaining healthy weight.The importance of adequate sleep also discussed. Regular seat belt  use and home safety, is also discussed. Changes in health habits are decided on by the patient with goals and time frames  set for achieving them. Immunization and cancer screening needs are specifically addressed at this visit.   Shortness of breath on exertion 2 month h/o reduced exercise tolerance and shortness of breath  EKG in office and cardiology eval  Essential hypertension Not at goal. Start maxzide , stop amlodipine  Refer card due to new SOB. EKG today  Morbid obesity (HCC)  Patient re-educated about  the importance of commitment to a  minimum of 150 minutes of exercise per week as able.  The importance of healthy food choices with portion control discussed, as well as eating regularly and within a 12 hour window most days. The need to choose clean , green food 50 to 75% of the time is discussed, as well as to make water the primary drink and set a goal of 64 ounces water daily.       10/07/2023    1:38 PM 05/20/2023    3:25 PM 03/13/2023    3:12 PM  Weight /BMI  Weight 237 lb 240 lb 1.3 oz 233 lb 0.6 oz  Height 5' 7 (1.702 m) 5' 7 (1.702 m) 5' 7 (1.702 m)  BMI 37.12 kg/m2 37.6 kg/m2 36.5 kg/m2      Hyperlipidemia LDL goal <100 Slight improvement Hyperlipidemia:Low fat diet discussed and encouraged.   Lipid Panel  Lab Results  Component Value Date   CHOL 222 (H) 10/06/2023  HDL 56 10/06/2023   LDLCALC 142 (H) 10/06/2023   TRIG 133 10/06/2023   CHOLHDL 4.0 10/06/2023

## 2023-10-21 NOTE — Progress Notes (Signed)
 Kendra Wong     MRN: [id number]      DOB: [birth year]  Chief Complaint  Patient presents with  . Annual Exam    Has been having bilateral ankle swelling and SOB with any activity for the past couple of months     HPI: Patient is in for annual physical exam. C/o poor exercise tolerance and SOB over last 2 to 3 months , also c/o ankle swelling Immunization is reviewed , and  updated if needed.   PE: BP (!) 134/90   Pulse 83   Resp 16   Ht 5' 7 (1.702 m)   Wt 237 lb (107.5 kg)   SpO2 93%   BMI 37.12 kg/m   Pleasant  female, alert and oriented x 3, in no cardio-pulmonary distress. Afebrile. HEENT No facial trauma or asymetry. Sinuses non tender.  Extra occullar muscles intact.. External ears normal, . Neck: supple, no adenopathy,JVD or thyromegaly.No bruits.  Chest: Clear to ascultation bilaterally.No crackles or wheezes. Non tender to palpation  Breast: Asymptomatic an d mammogram upcoming , not examined  Cardiovascular system; Heart sounds normal,  S1 and  S2 ,no S3.  No murmur, or thrill. Apical beat not displaced Peripheral pulses normal.  Abdomen: Soft, non tender  GU: Asymptomatic, not examines papis UTD  Musculoskeletal exam: Full ROM of spine, hips , shoulders and knees. No deformity ,swelling noted. No muscle wasting or atrophy.   Neurologic: Cranial nerves 2 to 12 intact. Power, tone ,sensation normal throughout. No disturbance in gait. No tremor.  Skin: Intact, no ulceration, erythema , scaling or rash noted. Pigmentation normal throughout  Psych; Normal mood and affect. Judgement and concentration normal   Assessment & Plan:  Annual visit for general adult medical examination with abnormal findings Annual exam as documented. Counseling done  re healthy lifestyle involving commitment to 150 minutes exercise per week, heart healthy diet, and attaining healthy weight.The importance of adequate sleep also discussed. Regular seat belt  use and home safety, is also discussed. Changes in health habits are decided on by the patient with goals and time frames  set for achieving them. Immunization and cancer screening needs are specifically addressed at this visit.   Shortness of breath on exertion 2 month h/o reduced exercise tolerance and shortness of breath  EKG in office and cardiology eval  Essential hypertension Not at goal. Start maxzide , stop amlodipine  Refer card due to new SOB. EKG today  Morbid obesity (HCC)  Patient re-educated about  the importance of commitment to a  minimum of 150 minutes of exercise per week as able.  The importance of healthy food choices with portion control discussed, as well as eating regularly and within a 12 hour window most days. The need to choose clean , green food 50 to 75% of the time is discussed, as well as to make water the primary drink and set a goal of 64 ounces water daily.       10/07/2023    1:38 PM 05/20/2023    3:25 PM 03/13/2023    3:12 PM  Weight /BMI  Weight 237 lb 240 lb 1.3 oz 233 lb 0.6 oz  Height 5' 7 (1.702 m) 5' 7 (1.702 m) 5' 7 (1.702 m)  BMI 37.12 kg/m2 37.6 kg/m2 36.5 kg/m2      Hyperlipidemia LDL goal <100 Slight improvement Hyperlipidemia:Low fat diet discussed and encouraged.   Lipid Panel  Lab Results  Component Value Date   CHOL 222 (H) 10/06/2023  HDL 56 10/06/2023   LDLCALC 142 (H) 10/06/2023   TRIG 133 10/06/2023   CHOLHDL 4.0 10/06/2023

## 2023-10-28 ENCOUNTER — Encounter: Payer: Self-pay | Admitting: Family Medicine

## 2023-10-30 ENCOUNTER — Ambulatory Visit (INDEPENDENT_AMBULATORY_CARE_PROVIDER_SITE_OTHER): Admitting: Otolaryngology

## 2023-11-04 ENCOUNTER — Ambulatory Visit (INDEPENDENT_AMBULATORY_CARE_PROVIDER_SITE_OTHER)

## 2023-11-04 ENCOUNTER — Ambulatory Visit

## 2023-11-04 VITALS — BP 144/88

## 2023-11-04 DIAGNOSIS — I10 Essential (primary) hypertension: Secondary | ICD-10-CM

## 2023-11-04 NOTE — Progress Notes (Signed)
 Patient is in office today for a nurse visit for Blood Pressure Check. Patient blood pressure was 144/88, Patient No dyspnea on exertion

## 2024-01-29 ENCOUNTER — Ambulatory Visit (INDEPENDENT_AMBULATORY_CARE_PROVIDER_SITE_OTHER): Admitting: Otolaryngology

## 2024-01-29 VITALS — BP 132/88 | HR 70 | Temp 97.0°F | Ht 67.0 in | Wt 230.0 lb

## 2024-01-29 DIAGNOSIS — H608X3 Other otitis externa, bilateral: Secondary | ICD-10-CM

## 2024-01-29 DIAGNOSIS — J343 Hypertrophy of nasal turbinates: Secondary | ICD-10-CM

## 2024-01-29 DIAGNOSIS — R0981 Nasal congestion: Secondary | ICD-10-CM

## 2024-01-29 DIAGNOSIS — J310 Chronic rhinitis: Secondary | ICD-10-CM | POA: Diagnosis not present

## 2024-01-31 DIAGNOSIS — J343 Hypertrophy of nasal turbinates: Secondary | ICD-10-CM | POA: Insufficient documentation

## 2024-01-31 NOTE — Progress Notes (Signed)
 Patient ID: Kendra Wong, female   DOB: [birth year], 48 y.o.   MRN: [id number]  Follow-up: Bilateral chronic eczematous otitis externa, chronic nasal congestion  HPI: The patient is a 48 year old female who returns today for her follow-up evaluation.  The patient was previously seen for frequent itchy ears.  She was noted to have chronic eczematous otitis externa.  She was treated with Elocon  cream.  The patient returns today reporting occasional itchy sensation in the ears.  She also complains of frequent nasal congestion and headaches.  She denies any fever or visual change.  Exam: General: Communicates without difficulty, well nourished, no acute distress. Head: Normocephalic, no evidence injury, no tenderness, facial buttresses intact without stepoff. Face/sinus: No tenderness to palpation and percussion. Facial movement is normal and symmetric. Eyes: PERRL, EOMI. No scleral icterus, conjunctivae clear. Neuro: CN II exam reveals vision grossly intact.  No nystagmus at any point of gaze. Ears: Auricles well formed without lesions.  Eczematous changes are noted within the ear canals.  Both tympanic membranes and middle ear spaces are normal.  Nose: External evaluation reveals normal support and skin without lesions.  Dorsum is intact.  Anterior rhinoscopy reveals congested mucosa over anterior aspect of inferior turbinates and intact septum.  No purulence noted. Oral:  Oral cavity and oropharynx are intact, symmetric, without erythema or edema.  Mucosa is moist without lesions. Neck: Full range of motion without pain.  There is no significant lymphadenopathy.  No masses palpable.  Thyroid bed within normal limits to palpation.  Parotid glands and submandibular glands equal bilaterally without mass.  Trachea is midline. Neuro:  CN 2-12 grossly intact.   Assessment: 1.  Bilateral chronic eczematous otitis externa. 2.  Chronic rhinitis with nasal mucosal congestion and bilateral inferior turbinate  hypertrophy.  Plan: 1.  The physical exam findings are reviewed with the patient. 2.  Continue with Elocon  cream as needed to treat the eczematous otitis externa. 3.  Flonase  nasal spray 2 sprays each nostril daily. 4.  The patient will return for reevaluation in 6 months.

## 2024-02-06 ENCOUNTER — Ambulatory Visit: Payer: Self-pay | Admitting: Family Medicine

## 2024-02-06 ENCOUNTER — Ambulatory Visit: Admitting: Family Medicine

## 2024-02-24 IMAGING — MG MM DIGITAL SCREENING BILAT W/ TOMO AND CAD
8 series · 8 of 24 positions shown · non-contrast
Comparison: Previous exam(s).

CLINICAL DATA: Screening.

EXAM:
DIGITAL SCREENING BILATERAL MAMMOGRAM WITH TOMOSYNTHESIS AND CAD
TECHNIQUE: Bilateral screening digital craniocaudal and mediolateral oblique
mammograms were obtained. Bilateral screening digital breast
tomosynthesis was performed. The images were evaluated with
computer-aided detection.

[L CC synth-2D]
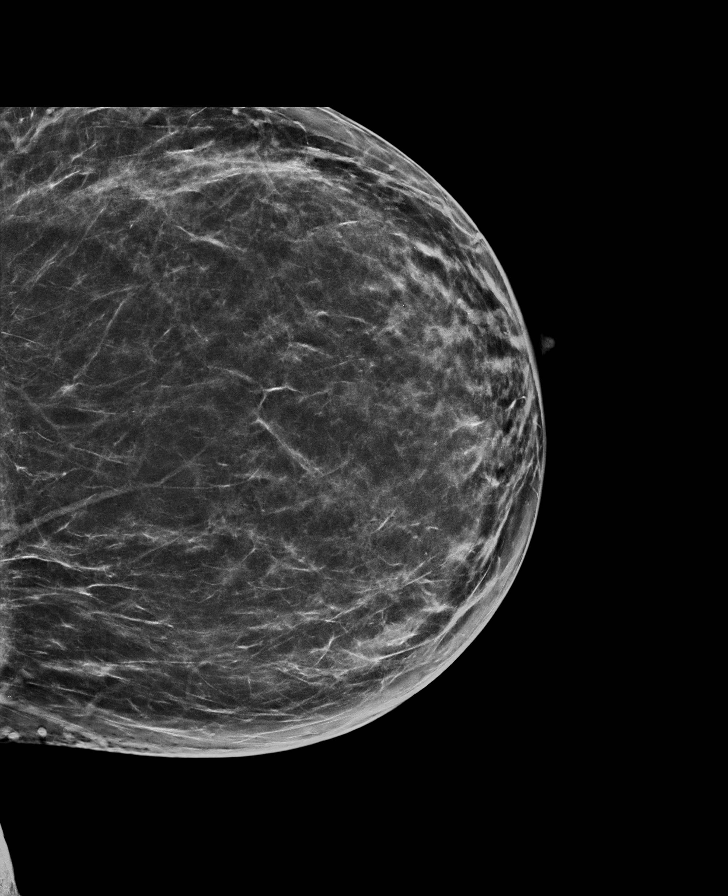

[R MLO synth-2D]
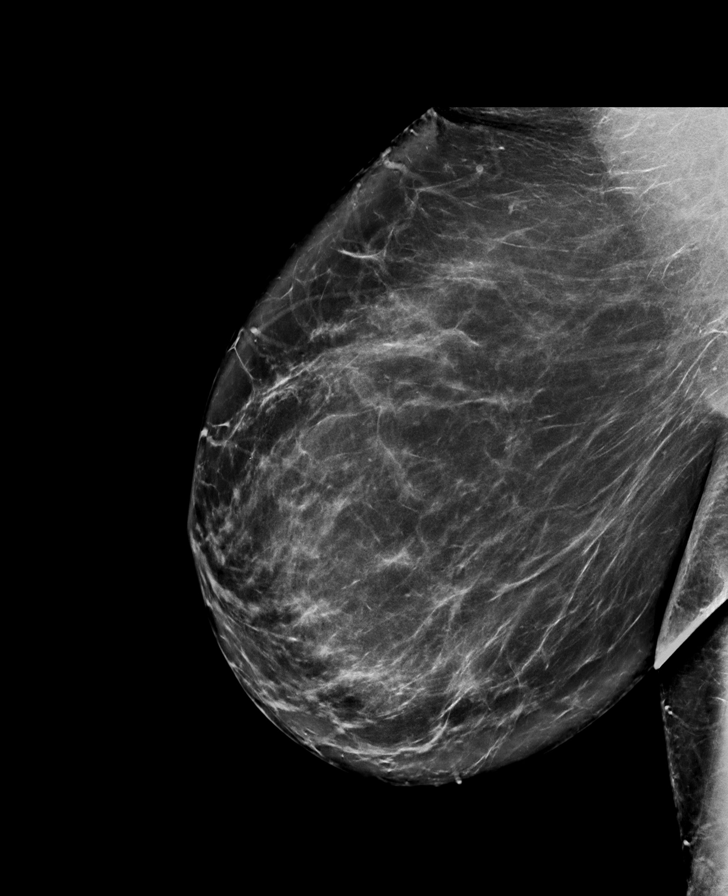

[R CC synth-2D]
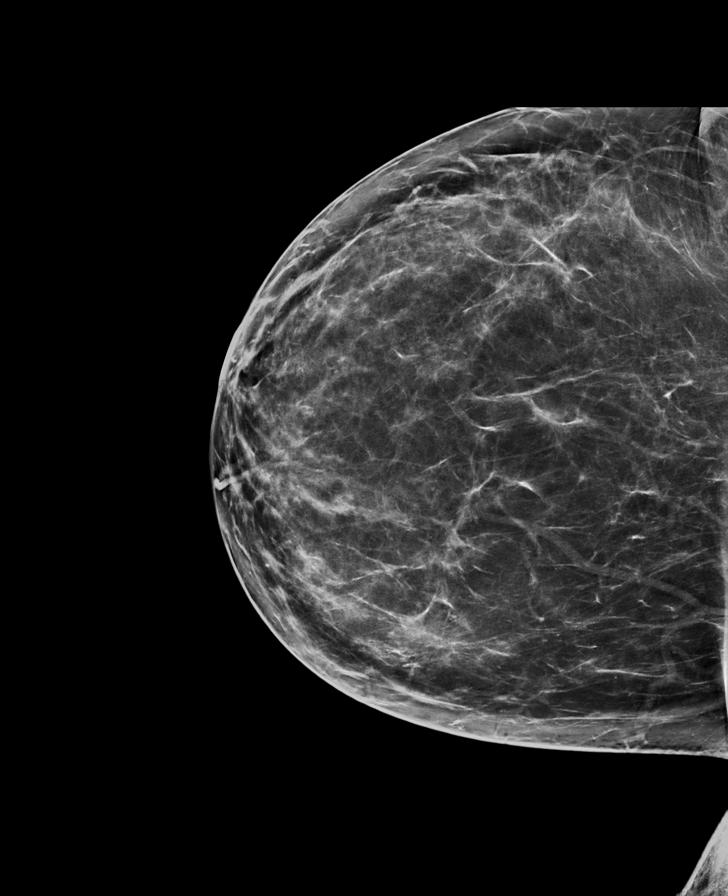

[L MLO synth-2D]
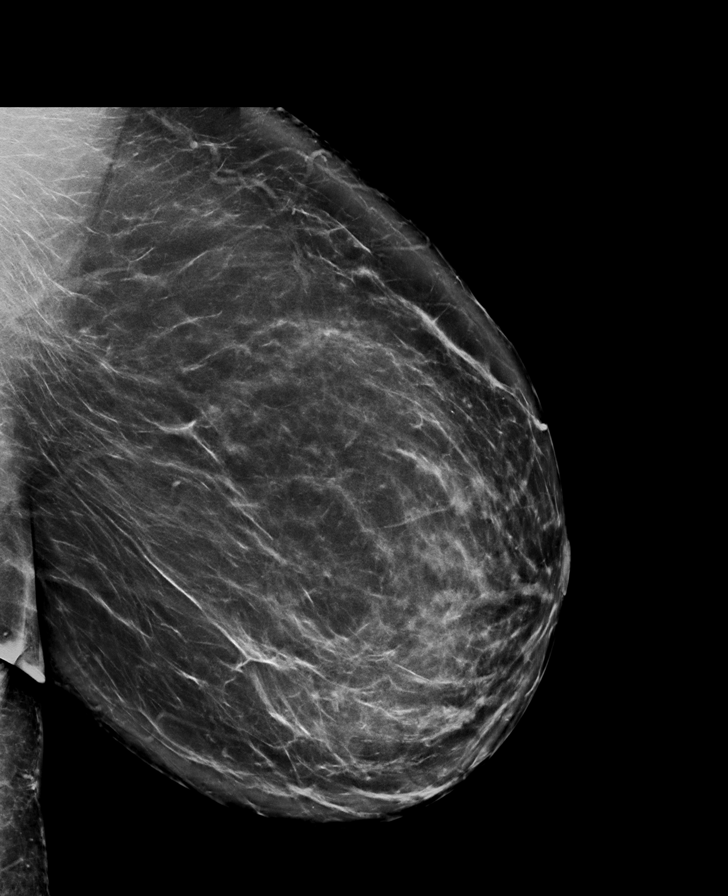

[L CC tomo · tomo slice 42/83.0]
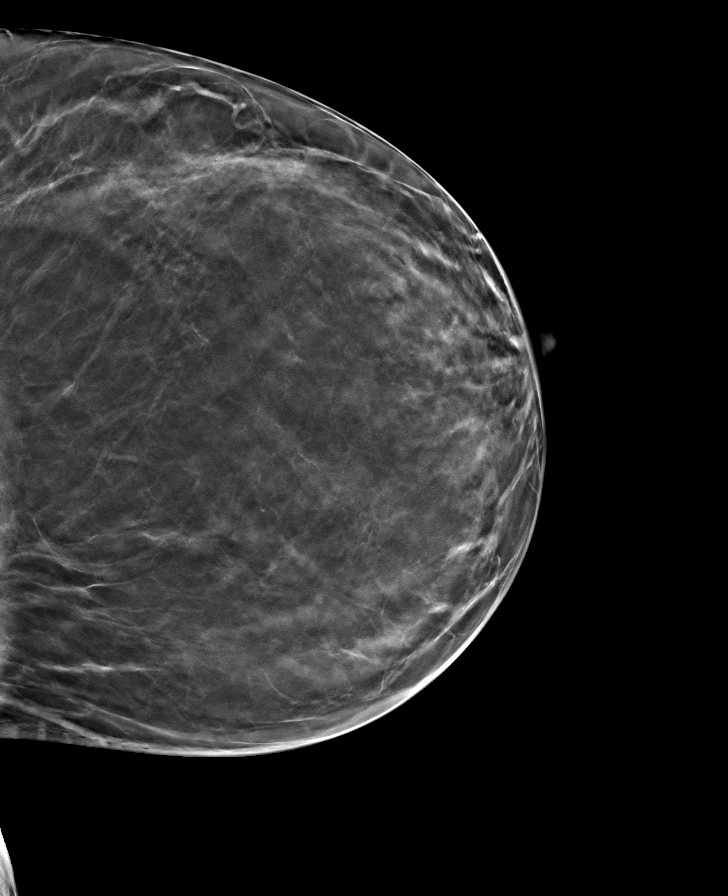

[R MLO tomo · tomo slice 51/102.0]
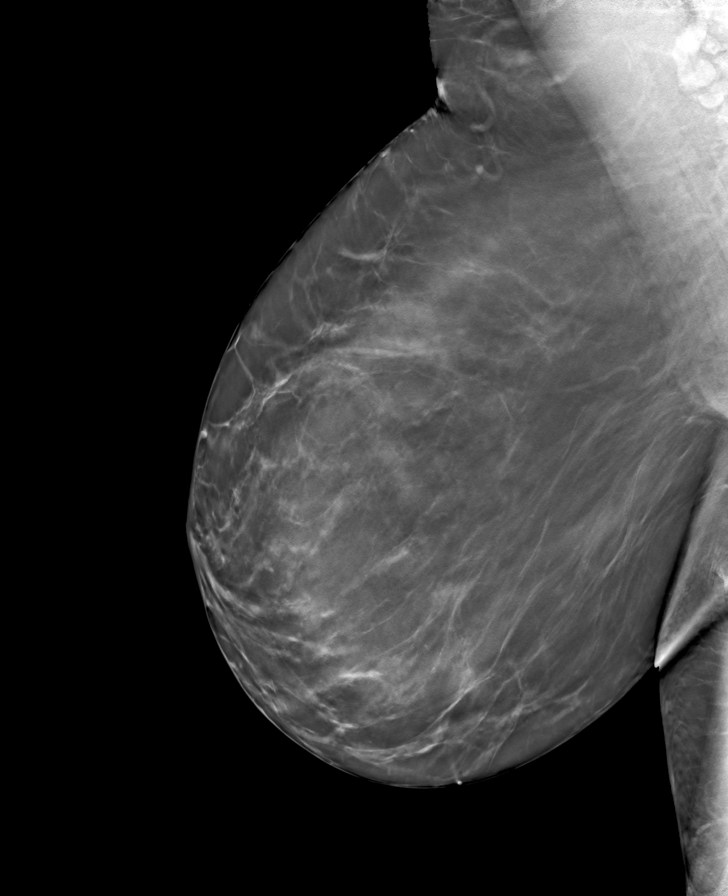

[L MLO tomo · tomo slice 50/99.0]
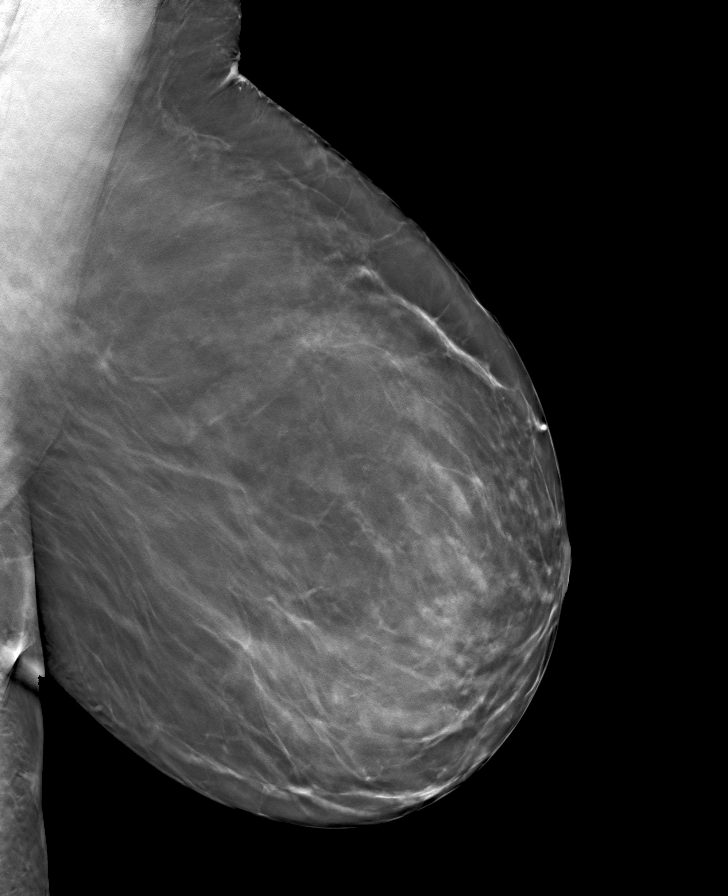

[R CC tomo · tomo slice 45/89.0]
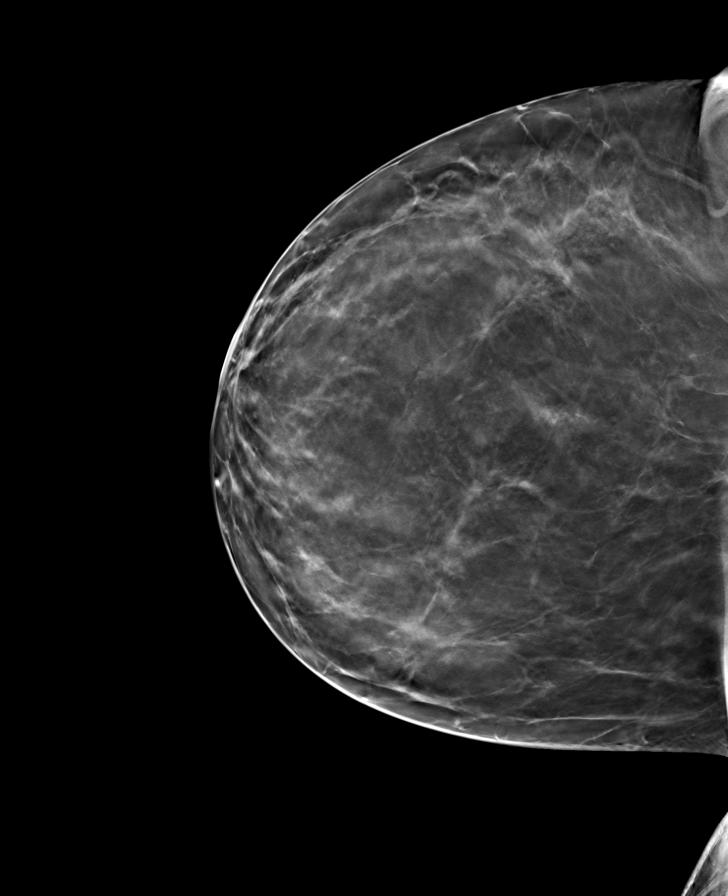

[8 of 24 positions shown; findings below may reference images not displayed]

ACR Breast Density Category b: There are scattered areas of
fibroglandular density.
FINDINGS: There are no findings suspicious for malignancy.
IMPRESSION: No mammographic evidence of malignancy. A result letter of this
screening mammogram will be mailed directly to the patient.

RECOMMENDATION:
Screening mammogram in one year. (Code:51-O-LD2)

BI-RADS CATEGORY  1: Negative.

## 2024-03-22 ENCOUNTER — Ambulatory Visit: Payer: Self-pay

## 2024-03-22 NOTE — Telephone Encounter (Signed)
 FYI Only or Action Required?: Action required by provider: referral request.  Patient was last seen in primary care on 10/07/2023 by Kendra Rollene BRAVO, MD.  Called Nurse Triage reporting Mass.  Symptoms began several weeks ago.  Interventions attempted: Other: dandruff shampoo.  Symptoms are: gradually worsening.  Triage Disposition: See PCP Within 2 Weeks  Patient/caregiver understands and will follow disposition?: No, wishes to speak with PCP    Copied from CRM [id number]. Topic: Clinical - Red Word Triage >> Mar 22, 2024 12:28 PM Kendra Wong wrote: [name] that prompted transfer to Nurse Triage: patient has a larger knot on her in her scalp the size of her fist with burning and clear liquid coming out of it and it is causing her pain Reason for Disposition  [1] Dry skin AND [2] no improvement using Care Advice  Answer Assessment - Initial Assessment Questions Additional info: Refuses office visit. Requesting referral to dermatology.     1. MAIN SYMPTOM: What is your main concern or symptom? (e.g., dry, flaky, bumpy or rough, cracked, itchy)     Dry scabbed scaly patch  2. LOCATION: Where is the cracked or dry skin located?       Crown of head 3. ONSET: When did the cracked or dry skin begin?     Several weeks 4. CAUSE: What do you think is causing the cracked or dry skin?     Thought dandruff 5. PAIN: Is there any pain? If Yes, ask: How bad is the pain?  (e.g., Scale 1-10; mild, moderate, or severe)     mild 6. INFECTION: Does the skin look red or infected?     Has clear fluid leaking 7. OTHER SYMPTOMS: Do you have any other symptoms? (e.g.,  fever, itching, rash or redness)     Was warm to touch on Sunday. Used dandruff shampoo on Sunday that caused burning and scab to come off, had scant clear drainage.  Protocols used: Cracked or Dry Skin-A-AH

## 2024-03-24 NOTE — Telephone Encounter (Signed)
 Patient advised, she will seek an appointment for a dermatologist but if needs referral she will schedule an appointment

## 2024-04-08 ENCOUNTER — Ambulatory Visit (INDEPENDENT_AMBULATORY_CARE_PROVIDER_SITE_OTHER): Admitting: Otolaryngology

## 2024-07-29 ENCOUNTER — Ambulatory Visit (INDEPENDENT_AMBULATORY_CARE_PROVIDER_SITE_OTHER): Admitting: Otolaryngology
